# Patient Record
Sex: Female | Born: 1960 | Race: White | Hispanic: No | Marital: Married | Smoking: Never smoker
Health system: Southern US, Community
[De-identification: ages and names within clinical notes are randomized; demographics above are authoritative.]

## PROBLEM LIST (undated history)

## (undated) DIAGNOSIS — E7800 Pure hypercholesterolemia, unspecified: Secondary | ICD-10-CM

## (undated) DIAGNOSIS — R32 Unspecified urinary incontinence: Secondary | ICD-10-CM

## (undated) DIAGNOSIS — M797 Fibromyalgia: Secondary | ICD-10-CM

## (undated) DIAGNOSIS — I10 Essential (primary) hypertension: Secondary | ICD-10-CM

## (undated) DIAGNOSIS — F419 Anxiety disorder, unspecified: Secondary | ICD-10-CM

## (undated) DIAGNOSIS — F329 Major depressive disorder, single episode, unspecified: Secondary | ICD-10-CM

## (undated) DIAGNOSIS — K219 Gastro-esophageal reflux disease without esophagitis: Secondary | ICD-10-CM

## (undated) DIAGNOSIS — G2581 Restless legs syndrome: Secondary | ICD-10-CM

## (undated) DIAGNOSIS — IMO0002 Reserved for concepts with insufficient information to code with codable children: Secondary | ICD-10-CM

## (undated) DIAGNOSIS — F32A Depression, unspecified: Secondary | ICD-10-CM

## (undated) HISTORY — DX: Restless legs syndrome: G25.81

## (undated) HISTORY — DX: Fibromyalgia: M79.7

## (undated) HISTORY — DX: Depression, unspecified: F32.A

## (undated) HISTORY — DX: Unspecified urinary incontinence: R32

## (undated) HISTORY — DX: Anxiety disorder, unspecified: F41.9

## (undated) HISTORY — DX: Major depressive disorder, single episode, unspecified: F32.9

## (undated) HISTORY — DX: Pure hypercholesterolemia, unspecified: E78.00

## (undated) HISTORY — DX: Reserved for concepts with insufficient information to code with codable children: IMO0002

## (undated) HISTORY — DX: Essential (primary) hypertension: I10

## (undated) HISTORY — DX: Gastro-esophageal reflux disease without esophagitis: K21.9

---

## 1992-04-07 HISTORY — PX: DIAGNOSTIC LAPAROSCOPY: SUR761

## 1997-04-07 DIAGNOSIS — E7800 Pure hypercholesterolemia, unspecified: Secondary | ICD-10-CM

## 1997-04-07 HISTORY — DX: Pure hypercholesterolemia, unspecified: E78.00

## 1998-05-12 ENCOUNTER — Encounter: Payer: Self-pay | Admitting: Emergency Medicine

## 1998-05-12 ENCOUNTER — Emergency Department (HOSPITAL_COMMUNITY): Admission: EM | Admit: 1998-05-12 | Discharge: 1998-05-12 | Payer: Self-pay | Admitting: Emergency Medicine

## 1998-05-12 ENCOUNTER — Encounter: Payer: Self-pay | Admitting: *Deleted

## 1999-01-08 ENCOUNTER — Other Ambulatory Visit: Admission: RE | Admit: 1999-01-08 | Discharge: 1999-01-08 | Payer: Self-pay | Admitting: Obstetrics and Gynecology

## 1999-12-18 ENCOUNTER — Encounter: Admission: RE | Admit: 1999-12-18 | Discharge: 1999-12-18 | Payer: Self-pay | Admitting: Family Medicine

## 1999-12-18 ENCOUNTER — Encounter: Payer: Self-pay | Admitting: Family Medicine

## 2000-02-04 ENCOUNTER — Other Ambulatory Visit: Admission: RE | Admit: 2000-02-04 | Discharge: 2000-02-04 | Payer: Self-pay | Admitting: Obstetrics and Gynecology

## 2000-06-29 ENCOUNTER — Ambulatory Visit (HOSPITAL_COMMUNITY): Admission: RE | Admit: 2000-06-29 | Discharge: 2000-06-29 | Payer: Self-pay | Admitting: *Deleted

## 2000-06-30 ENCOUNTER — Encounter: Admission: RE | Admit: 2000-06-30 | Discharge: 2000-06-30 | Payer: Self-pay | Admitting: *Deleted

## 2000-06-30 ENCOUNTER — Encounter: Payer: Self-pay | Admitting: *Deleted

## 2001-04-07 DIAGNOSIS — G2581 Restless legs syndrome: Secondary | ICD-10-CM

## 2001-04-07 HISTORY — DX: Restless legs syndrome: G25.81

## 2001-04-30 ENCOUNTER — Other Ambulatory Visit: Admission: RE | Admit: 2001-04-30 | Discharge: 2001-04-30 | Payer: Self-pay | Admitting: Obstetrics and Gynecology

## 2002-04-07 HISTORY — PX: TUBAL LIGATION: SHX77

## 2002-05-17 ENCOUNTER — Other Ambulatory Visit: Admission: RE | Admit: 2002-05-17 | Discharge: 2002-05-17 | Payer: Self-pay | Admitting: Obstetrics and Gynecology

## 2002-08-17 ENCOUNTER — Encounter: Admission: RE | Admit: 2002-08-17 | Discharge: 2002-08-29 | Payer: Self-pay | Admitting: *Deleted

## 2002-10-26 ENCOUNTER — Ambulatory Visit (HOSPITAL_COMMUNITY): Admission: RE | Admit: 2002-10-26 | Discharge: 2002-10-26 | Payer: Self-pay | Admitting: Obstetrics and Gynecology

## 2002-10-26 ENCOUNTER — Encounter: Payer: Self-pay | Admitting: Obstetrics and Gynecology

## 2002-11-11 ENCOUNTER — Ambulatory Visit (HOSPITAL_COMMUNITY): Admission: RE | Admit: 2002-11-11 | Discharge: 2002-11-11 | Payer: Self-pay | Admitting: Obstetrics and Gynecology

## 2002-11-22 ENCOUNTER — Ambulatory Visit (HOSPITAL_BASED_OUTPATIENT_CLINIC_OR_DEPARTMENT_OTHER): Admission: RE | Admit: 2002-11-22 | Discharge: 2002-11-22 | Payer: Self-pay | Admitting: Family Medicine

## 2003-04-08 DIAGNOSIS — I10 Essential (primary) hypertension: Secondary | ICD-10-CM

## 2003-04-08 HISTORY — DX: Essential (primary) hypertension: I10

## 2003-06-23 ENCOUNTER — Other Ambulatory Visit: Admission: RE | Admit: 2003-06-23 | Discharge: 2003-06-23 | Payer: Self-pay | Admitting: Obstetrics and Gynecology

## 2003-11-14 ENCOUNTER — Ambulatory Visit (HOSPITAL_COMMUNITY): Admission: RE | Admit: 2003-11-14 | Discharge: 2003-11-14 | Payer: Self-pay | Admitting: Obstetrics and Gynecology

## 2003-11-20 ENCOUNTER — Encounter: Admission: RE | Admit: 2003-11-20 | Discharge: 2003-11-20 | Payer: Self-pay | Admitting: Obstetrics and Gynecology

## 2004-04-07 DIAGNOSIS — IMO0002 Reserved for concepts with insufficient information to code with codable children: Secondary | ICD-10-CM

## 2004-04-07 HISTORY — DX: Reserved for concepts with insufficient information to code with codable children: IMO0002

## 2004-10-07 ENCOUNTER — Other Ambulatory Visit: Admission: RE | Admit: 2004-10-07 | Discharge: 2004-10-07 | Payer: Self-pay | Admitting: Obstetrics and Gynecology

## 2004-11-14 ENCOUNTER — Ambulatory Visit (HOSPITAL_COMMUNITY): Admission: RE | Admit: 2004-11-14 | Discharge: 2004-11-14 | Payer: Self-pay | Admitting: Obstetrics and Gynecology

## 2005-02-25 ENCOUNTER — Other Ambulatory Visit: Admission: RE | Admit: 2005-02-25 | Discharge: 2005-02-25 | Payer: Self-pay | Admitting: Obstetrics and Gynecology

## 2005-07-21 ENCOUNTER — Other Ambulatory Visit: Admission: RE | Admit: 2005-07-21 | Discharge: 2005-07-21 | Payer: Self-pay | Admitting: Obstetrics and Gynecology

## 2005-10-07 ENCOUNTER — Other Ambulatory Visit: Admission: RE | Admit: 2005-10-07 | Discharge: 2005-10-07 | Payer: Self-pay | Admitting: Obstetrics and Gynecology

## 2005-11-27 ENCOUNTER — Ambulatory Visit (HOSPITAL_COMMUNITY): Admission: RE | Admit: 2005-11-27 | Discharge: 2005-11-27 | Payer: Self-pay | Admitting: Obstetrics and Gynecology

## 2006-12-16 ENCOUNTER — Ambulatory Visit (HOSPITAL_COMMUNITY): Admission: RE | Admit: 2006-12-16 | Discharge: 2006-12-16 | Payer: Self-pay | Admitting: Obstetrics and Gynecology

## 2007-10-20 ENCOUNTER — Ambulatory Visit (HOSPITAL_BASED_OUTPATIENT_CLINIC_OR_DEPARTMENT_OTHER): Admission: RE | Admit: 2007-10-20 | Discharge: 2007-10-20 | Payer: Self-pay | Admitting: Urology

## 2007-12-20 ENCOUNTER — Ambulatory Visit (HOSPITAL_COMMUNITY): Admission: RE | Admit: 2007-12-20 | Discharge: 2007-12-20 | Payer: Self-pay | Admitting: Obstetrics and Gynecology

## 2008-04-07 HISTORY — PX: BLADDER SUSPENSION: SHX72

## 2008-12-20 ENCOUNTER — Ambulatory Visit (HOSPITAL_COMMUNITY): Admission: RE | Admit: 2008-12-20 | Discharge: 2008-12-20 | Payer: Self-pay | Admitting: Obstetrics and Gynecology

## 2009-05-08 ENCOUNTER — Emergency Department (HOSPITAL_COMMUNITY): Admission: EM | Admit: 2009-05-08 | Discharge: 2009-05-08 | Payer: Self-pay | Admitting: Emergency Medicine

## 2009-12-21 ENCOUNTER — Ambulatory Visit (HOSPITAL_COMMUNITY): Admission: RE | Admit: 2009-12-21 | Discharge: 2009-12-21 | Payer: Self-pay | Admitting: Obstetrics and Gynecology

## 2010-08-20 NOTE — Op Note (Signed)
NAMEJULISA, [name]             ACCOUNT NO.:  [id number]   MEDICAL RECORD NO.:  [id number]          PATIENT TYPE:  AMB   LOCATION:  [facility]                         FACILITY:  [facility]   PHYSICIAN:  Ronald L. Earlene Plater, M.D.  DATE OF BIRTH:  04/05/1961   DATE OF PROCEDURE:  10/20/2007  DATE OF DISCHARGE:                               OPERATIVE REPORT   DIAGNOSIS:  Stress urinary incontinence.   OPERATIVE PROCEDURE:  Cystourethroscopy, placement of Lynx suprapubic  sling, and placement of Foley catheter.   SURGEON:  Lucrezia Starch. Earlene Plater, M.D.   ANESTHESIA:  LMA.   BLOOD LOSS:  150 mL.   TUBES:  A 16-French Foley, and vaginal pack   COMPLICATIONS:  Simple needle hole in right side of bladder.   PROCEDURE:  Heather [name] is a lovely 50 year old white female who  presents with a 15-year history of significant stress urinary  incontinence.  She has other underlying causes, but on urodynamics was  found to have some irritability and hypersensitivity of the bladder.  However, she was found to have a Valsalva leak point pressures of 55  cmH2O.  After understanding risks, benefits, and alternatives, she has  elected proceed with the above procedure.   PROCEDURE IN DETAIL:  The patient was placed in the supine position and,  after proper LMA anesthesia, was placed in the dorsal lithotomy  position, prepped and draped with Betadine in a sterile fashion.  A 16-  French Foley catheter was placed, and the bladder was drained.  The  submucosal area was injected with proximally 3 mL of 1% Xylocaine with  epinephrine.  A midline incision was made at the urethrovesical  junction, and flaps were created.  The endopelvic fascia was not  perforated.  Puncture wounds were obtained 1 fingerbreadth superior and  2 fingerbreadths lateral to the midline, superior to the pubic symphysis  and lateral to the midline, and utilizing the needles each was placed  through the endopelvic fascia, both the right and left  side of the  urethrovesical junction.  Inspection of bladder revealed that the right  needle had tangentially pierced the bladder.  Therefore, it was removed,  placed more laterally, and placed in position.  Reinspection revealed  that there were no needles within the bladder utilizing the 70-degree  lens. The [place] sling was then placed in position.  It deployed properly  and appeared to be in good position, and the vaginal wound was closed  with a running 2-0 Vicryl suture.  Reinspection of the bladder again on  distention revealed that there were no perforations, and the sling  appeared to be in what was felt to be good position, and the tapes were  cut at the skin level, and the skin was closed with Dermabond at the  puncture holes.  A  vaginal pack was placed with bacitracin ointment.  A 16-French Foley was  placed due to the small needle puncture hole.  It will be maintained for  approximately 5 days.  The patient tolerated the procedure well.  No  complications.  She was taken to the recovery room stable.  Ronald L. Earlene Plater, M.D.  Electronically Signed     RLD/MEDQ  D:  10/20/2007  T:  10/20/2007  Job:  [id number]

## 2010-08-23 NOTE — Op Note (Signed)
NAME:  Heather [name], Heather [name]                       ACCOUNT NO.:  [id number]   MEDICAL RECORD NO.:  [id number]                   PATIENT TYPE:  AMB   LOCATION:  [facility]                                  FACILITY:  [facility]   PHYSICIAN:  Cynthia P. Romine, M.D.             DATE OF BIRTH:  07-19-1960   DATE OF PROCEDURE:  11/11/2002  DATE OF DISCHARGE:                                 OPERATIVE REPORT   PREOPERATIVE DIAGNOSIS:  Desires permanent surgical sterilization.   POSTOPERATIVE DIAGNOSIS:  Desires permanent surgical sterilization.   PROCEDURE:  Falope laparoscopic tubal sterilization procedure.   SURGEON:  Cynthia P. Romine, M.D.   ANESTHESIA:  General endotracheal anesthesia.   ESTIMATED BLOOD LOSS:  Minimal.   COMPLICATIONS:  None.   PROCEDURE:  The patient was taken to the operating room and after induction  of adequate general endotracheal anesthesia, was placed in the dorsal  lithotomy position and prepped and draped in the usual fashion.  A Hulka  uterine manipulator was placed, the bladder was drained with a red rubber  catheter.  A small subumbilical incision was made, the Veress needle was  inserted into the peritoneal space.  Proper placement of the Veress needle  was tested with negative aspirate and free flow of saline through the Veress  needle, again with negative aspirate, and then by noting the response of a  drop of saline placed at the hub of the Veress needle with negative pressure  as the abdominal wall was elevated.  Pneumoperitoneum was secured with 2.5  liters of CO2.  A 10/11 mm trocar was then inserted into the peritoneal  space and proper placement noted with the laparoscope.  Next, a small  suprapubic incision was made and an 8 mm trocar was inserted under direct  visualization.  The pelvis was inspected and felt to be entirely normal.  The tubes and ovaries were freely mobile.  The uterus was a normal size and  shape.  There were no adhesions.  The appendix  was normal.  The Falope ring  applier was introduced and the Falope ring was placed.  The tube was  identified and carried to its fimbriated end.  The Falope ring was placed.  A good knuckle of tube was noted to be contained and no bleeding was  encountered.  Photographic documentation was taken of ring placement.  The  procedure was repeated on the left, again finding the tube and tracing it to  the fimbriated end.  The middle portion was elevated and a Falope ring was  placed.  Again, a good knuckle of tube was noted to be contained within the  lumen.  There was good blanching noted in the tube above the ring and no  bleeding was encountered.  The Falope ring applier was removed.  The  pneumoperitoneum was allowed to escape.  The trocar sleeves were removed.  The wounds were closed subcuticularly with 3-0 Vicryl.  Steri-Strips were  applied.  8 mL of 0.5% Marcaine with epinephrine was injected into the  incision for postop pain relief.  The instruments were removed from the  vagina and procedure was terminated.  The patient tolerated the procedure  well and went to the recovery room in satisfactory condition.                                              Cynthia P. Romine, M.D.   CPR/MEDQ  D:  11/11/2002  T:  11/11/2002  Job:  [id number]

## 2010-12-05 ENCOUNTER — Other Ambulatory Visit: Payer: Self-pay | Admitting: Obstetrics and Gynecology

## 2010-12-05 DIAGNOSIS — Z1231 Encounter for screening mammogram for malignant neoplasm of breast: Secondary | ICD-10-CM

## 2010-12-24 ENCOUNTER — Ambulatory Visit (HOSPITAL_COMMUNITY)
Admission: RE | Admit: 2010-12-24 | Discharge: 2010-12-24 | Disposition: A | Discharge status: Home / Self Care | Payer: BC Managed Care – PPO | Source: Ambulatory Visit | Attending: Obstetrics and Gynecology | Admitting: Obstetrics and Gynecology

## 2010-12-24 DIAGNOSIS — Z1231 Encounter for screening mammogram for malignant neoplasm of breast: Secondary | ICD-10-CM | POA: Insufficient documentation

## 2011-01-02 LAB — COMPREHENSIVE METABOLIC PANEL
ALT: 28
AST: 23
Albumin: 3.9
BUN: 15
CO2: 28
Calcium: 9.7
Chloride: 100
Potassium: 4.8
Sodium: 137
Total Protein: 6.2

## 2011-01-02 LAB — CBC
HCT: 37.7
Platelets: 247

## 2011-01-02 LAB — URINALYSIS, ROUTINE W REFLEX MICROSCOPIC
Glucose, UA: NEGATIVE
Ketones, ur: NEGATIVE
Protein, ur: NEGATIVE
Urobilinogen, UA: 0.2
pH: 6.5

## 2011-01-02 LAB — PROTIME-INR: Prothrombin Time: 12.4

## 2011-11-25 ENCOUNTER — Other Ambulatory Visit: Payer: Self-pay | Admitting: Obstetrics and Gynecology

## 2011-11-25 DIAGNOSIS — Z1231 Encounter for screening mammogram for malignant neoplasm of breast: Secondary | ICD-10-CM

## 2011-12-25 ENCOUNTER — Ambulatory Visit (HOSPITAL_COMMUNITY)
Admission: RE | Admit: 2011-12-25 | Discharge: 2011-12-25 | Disposition: A | Discharge status: Home / Self Care | Payer: BC Managed Care – PPO | Source: Ambulatory Visit | Attending: Obstetrics and Gynecology | Admitting: Obstetrics and Gynecology

## 2011-12-25 DIAGNOSIS — Z1231 Encounter for screening mammogram for malignant neoplasm of breast: Secondary | ICD-10-CM

## 2012-12-22 ENCOUNTER — Other Ambulatory Visit: Payer: Self-pay | Admitting: Gynecology

## 2012-12-22 DIAGNOSIS — Z1231 Encounter for screening mammogram for malignant neoplasm of breast: Secondary | ICD-10-CM

## 2012-12-29 ENCOUNTER — Encounter: Payer: Self-pay | Admitting: Obstetrics and Gynecology

## 2012-12-30 ENCOUNTER — Ambulatory Visit (HOSPITAL_COMMUNITY)
Admission: RE | Admit: 2012-12-30 | Discharge: 2012-12-30 | Disposition: A | Discharge status: Home / Self Care | Payer: BC Managed Care – PPO | Source: Ambulatory Visit | Attending: Gynecology | Admitting: Gynecology

## 2012-12-30 DIAGNOSIS — Z1231 Encounter for screening mammogram for malignant neoplasm of breast: Secondary | ICD-10-CM | POA: Insufficient documentation

## 2013-01-03 ENCOUNTER — Ambulatory Visit (INDEPENDENT_AMBULATORY_CARE_PROVIDER_SITE_OTHER): Payer: BC Managed Care – PPO | Admitting: Gynecology

## 2013-01-03 ENCOUNTER — Encounter: Payer: Self-pay | Admitting: Gynecology

## 2013-01-03 VITALS — BP 120/60 | HR 70 | Resp 16 | Ht 65.5 in | Wt 157.0 lb

## 2013-01-03 DIAGNOSIS — N898 Other specified noninflammatory disorders of vagina: Secondary | ICD-10-CM

## 2013-01-03 DIAGNOSIS — F329 Major depressive disorder, single episode, unspecified: Secondary | ICD-10-CM | POA: Insufficient documentation

## 2013-01-03 DIAGNOSIS — E7800 Pure hypercholesterolemia, unspecified: Secondary | ICD-10-CM | POA: Insufficient documentation

## 2013-01-03 DIAGNOSIS — Z01419 Encounter for gynecological examination (general) (routine) without abnormal findings: Secondary | ICD-10-CM

## 2013-01-03 DIAGNOSIS — Z124 Encounter for screening for malignant neoplasm of cervix: Secondary | ICD-10-CM

## 2013-01-03 DIAGNOSIS — Z0000 Encounter for general adult medical examination without abnormal findings: Secondary | ICD-10-CM

## 2013-01-03 DIAGNOSIS — G2581 Restless legs syndrome: Secondary | ICD-10-CM | POA: Insufficient documentation

## 2013-01-03 DIAGNOSIS — I10 Essential (primary) hypertension: Secondary | ICD-10-CM | POA: Insufficient documentation

## 2013-01-03 DIAGNOSIS — F32A Depression, unspecified: Secondary | ICD-10-CM | POA: Insufficient documentation

## 2013-01-03 LAB — POCT URINALYSIS DIPSTICK
Leukocytes, UA: NEGATIVE
Urobilinogen, UA: NEGATIVE

## 2013-01-03 MED ORDER — NYSTATIN-TRIAMCINOLONE 100000-0.1 UNIT/GM-% EX OINT: TOPICAL_OINTMENT | Freq: Two times a day (BID) | CUTANEOUS | Status: DC

## 2013-01-03 MED ORDER — FLUCONAZOLE 150 MG PO TABS: 150.0000 mg | ORAL_TABLET | Freq: Once | ORAL | Status: DC

## 2013-01-03 NOTE — Progress Notes (Signed)
52 y.o. Married Caucasian female   G1P1001 here for annual exam. Pt reports menses are absent..  She does not report hot flashes, does not have night sweats, does have vaginal dryness.  She is using lubricants, unknown.  She does not report post-menopasual bleeding.  LMP 7/13 no bleeding since.  Pt reports some vaginal dryness and itching.  Irregardless of sexual actiivty.  Pt is using an otc lubricant.    Patient's last menstrual period was 11/06/2011.          Sexually active: yes  The current method of family planning is post menopausal status.    Exercising: yes  bike, walk, yoga 3x/wk Last pap: 12/03/10 Negative Abnormal PAP: once 5 years ago, normal eversince Mammogram: 12/30/12 BI-RADS 1 BSE: not monthly q other month Colonoscopy:  no DEXA: none Alcohol: 6-8 glasses of wine/wk Tobacco: no  Hgb: PCP ; Urine: Negative  Health Maintenance  Topic Date Due  . Pap Smear  11/03/1980  . Mammogram  11/03/2012  . Colonoscopy  11/03/2012  . Influenza Vaccine  11/05/2012  . Tetanus/tdap  04/07/2014    Family History  Problem Relation Age of Onset  . Hypertension Mother   . Depression Mother   . Osteoporosis Mother 27  . Hypertension Father   . Depression Father   . Hypertension Maternal Grandmother   . Hypertension Maternal Grandfather   . Depression Paternal Grandmother     There are no active problems to display for this patient.   Past Medical History  Diagnosis Date  . Depression   . Anxiety   . Urinary incontinence     tx'd with [facility]  . Hypercholesteremia 1999  . Acid reflux   . Fibromyalgia   . Hypertension 2005  . 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 Restless leg syndrome 2003    Past Surgical History  Procedure Laterality Date  . Diagnostic laparoscopy  1994     Normal  . Tubal ligation  2004  . Bladder suspension  2010    Allergies: Penicillins; Sulfa antibiotics; and Codeine  Current Outpatient Prescriptions  Medication Sig Dispense Refill   . B Complex Vitamins (B COMPLEX PO) Take by mouth.      Marland Kitchen buPROPion (WELLBUTRIN XL) 300 MG 24 hr tablet Take 300 mg by mouth daily.      . Calcium-Magnesium-Vitamin D (CALCIUM MAGNESIUM PO) Take by mouth. Patient takes 1200 mg      . clonazePAM (KLONOPIN) 1 MG tablet Take 1 mg by mouth 2 (two) times daily as needed for anxiety.      Marland Kitchen escitalopram (LEXAPRO) 5 MG tablet Take 5 mg by mouth daily.      Marland Kitchen FOLIC ACID PO Take by mouth.      . hydrochlorothiazide (HYDRODIURIL) 25 MG tablet Take 25 mg by mouth daily.      Marland Kitchen LYSINE PO Take by mouth as needed.      . Nebivolol HCl (BYSTOLIC PO) Take by mouth.      . Omega-3 Fatty Acids (OMEGA 3 PO) Take by mouth.      . Pregabalin (LYRICA PO) Take by mouth.      . Ranitidine HCl (ZANTAC PO) Take by mouth as needed.       No current facility-administered medications for this visit.    ROS: Pertinent items are noted in HPI.  Exam:    BP 120/60  Pulse 70  Resp 16  LMP 11/06/2011 Weight change: @WEIGHTCHANGE @ Last 3 height recordings:  Ht Readings from Last 3 Encounters:  No data found for Ht   General appearance: alert, cooperative and appears stated age Head: Normocephalic, without obvious abnormality, atraumatic Neck: no adenopathy, no carotid bruit, no JVD, supple, symmetrical, trachea midline and thyroid not enlarged, symmetric, no tenderness/mass/nodules Lungs: clear to auscultation bilaterally Breasts: normal appearance, no masses or tenderness Heart: regular rate and rhythm, S1, S2 normal, no murmur, click, rub or gallop Abdomen: soft, non-tender; bowel sounds normal; no masses,  no organomegaly Extremities: extremities normal, atraumatic, no cyanosis or edema Skin: Skin color, texture, turgor normal. No rashes or lesions Lymph nodes: Cervical, supraclavicular, and axillary nodes normal. no inguinal nodes palpated Neurologic: Grossly normal   Pelvic: External genitalia:  Symmetrical rash with erythema from mid labia around  rectum              Urethra: normal appearing urethra with no masses, tenderness or lesions              Bartholins and Skenes: normal                 Vagina: normal appearing vagina with normal color and discharge, no lesions, vaginal discharge - white and scant, WET MOUNT done - results: negative for pathogens, normal epithelial cells, KOH done, pH 4.5              Cervix: normal appearance              Pap taken: yes        Bimanual Exam:  Uterus:  uterus is normal size, shape, consistency and nontender                                      Adnexa:    no masses                                      Rectovaginal: Confirms                                      Anus:  normal sphincter tone, no lesions  A: well woman Contact dermatitis     P: mammogram annual pap smear with HPV counseled on breast self exam, mammography screening, feminine hygiene, adequate intake of calcium and vitamin D, diet and exercise, suggested wearing wicking clothing when working out and changing when wet mycolog ointment to affected area, otc if possible, diflcuan, cotton underwear return annually or prn Discussed PAP guideline changes, importance of weight bearing exercises, calcium, vit D and balanced diet.  An After Visit Summary was printed and given to the patient.

## 2013-01-03 NOTE — Patient Instructions (Signed)

## 2013-01-04 ENCOUNTER — Ambulatory Visit: Payer: Self-pay | Admitting: Obstetrics and Gynecology

## 2013-01-04 ENCOUNTER — Ambulatory Visit: Payer: Self-pay | Admitting: Gynecology

## 2013-01-05 LAB — IPS PAP TEST WITH HPV

## 2013-01-07 ENCOUNTER — Telehealth: Payer: Self-pay | Admitting: *Deleted

## 2013-01-07 MED ORDER — TINIDAZOLE 500 MG PO TABS: ORAL_TABLET | ORAL | Status: DC

## 2013-01-07 NOTE — Telephone Encounter (Signed)
Message copied by Lorraine Lax on Fri Jan 07, 2013 10:32 AM ------      Message from: Douglass Rivers      Created: Thu Jan 06, 2013  8:06 AM       Recall 2, may have BV, had wet prep but not seen, she may want to try tindazole 2g for 2d (4x500mg ) call in 8tab if desires ------

## 2013-01-07 NOTE — Telephone Encounter (Signed)
Left Message To Call Back  

## 2013-01-07 NOTE — Telephone Encounter (Signed)
Patient notified see labs 

## 2013-01-07 NOTE — Addendum Note (Signed)
Addended by: Lorraine Lax on: 01/07/2013 11:33 AM   Modules accepted: Orders

## 2013-01-19 ENCOUNTER — Encounter: Payer: Self-pay | Admitting: Gynecology

## 2013-02-10 ENCOUNTER — Other Ambulatory Visit: Payer: Self-pay

## 2013-02-28 ENCOUNTER — Ambulatory Visit (INDEPENDENT_AMBULATORY_CARE_PROVIDER_SITE_OTHER): Payer: BC Managed Care – PPO | Admitting: Gynecology

## 2013-02-28 ENCOUNTER — Encounter: Payer: Self-pay | Admitting: Gynecology

## 2013-02-28 VITALS — BP 116/68 | HR 62 | Resp 14 | Ht 65.5 in | Wt 157.0 lb

## 2013-02-28 DIAGNOSIS — N898 Other specified noninflammatory disorders of vagina: Secondary | ICD-10-CM

## 2013-02-28 DIAGNOSIS — N762 Acute vulvitis: Secondary | ICD-10-CM

## 2013-02-28 DIAGNOSIS — N760 Acute vaginitis: Secondary | ICD-10-CM

## 2013-02-28 NOTE — Progress Notes (Signed)
Subjective:     Patient ID: Heather [name], female   DOB: [birth year], 52 y.o.   MRN: [id number]  HPI Comments: Pt here for f/u of a vulvar irritation and BV infection.  She aslo had a question regarding HPV infection she had years ago.    Review of Systems  Genitourinary: Negative for vaginal bleeding, vaginal discharge, vaginal pain and pelvic pain.  Skin: Negative for rash.       Objective:   Physical Exam  Nursing note and vitals reviewed. Constitutional: She is oriented to person, place, and time. She appears well-developed and well-nourished.  Genitourinary: Uterus normal. There is no rash, tenderness or lesion on the right labia. There is no rash, tenderness or lesion on the left labia. Cervix exhibits no motion tenderness and no discharge. Right adnexum displays no mass and no tenderness. Left adnexum displays no mass and no tenderness. No tenderness or bleeding around the vagina. No vaginal discharge found.  Lymphadenopathy:       Right: No inguinal adenopathy present.       Left: No inguinal adenopathy present.  Neurological: She is alert and oriented to person, place, and time.       Assessment:     Vulvitis Bacterial vaginosis     Plan:     Both have resolved, pt assured We discussed HPV infection and informed pt that although she had in past, she currently is negative, questions addressed.

## 2014-01-09 ENCOUNTER — Ambulatory Visit (INDEPENDENT_AMBULATORY_CARE_PROVIDER_SITE_OTHER): Payer: BC Managed Care – PPO | Admitting: Gynecology

## 2014-01-09 ENCOUNTER — Ambulatory Visit: Payer: BC Managed Care – PPO | Admitting: Gynecology

## 2014-01-09 ENCOUNTER — Encounter: Payer: Self-pay | Admitting: Gynecology

## 2014-01-09 VITALS — BP 124/70 | HR 68 | Ht 65.5 in | Wt 157.0 lb

## 2014-01-09 DIAGNOSIS — Z01419 Encounter for gynecological examination (general) (routine) without abnormal findings: Secondary | ICD-10-CM

## 2014-01-09 DIAGNOSIS — Z0000 Encounter for general adult medical examination without abnormal findings: Secondary | ICD-10-CM

## 2014-01-09 LAB — POCT URINALYSIS DIPSTICK
BILIRUBIN UA: NEGATIVE
Glucose, UA: NEGATIVE
Ketones, UA: NEGATIVE
Leukocytes, UA: NEGATIVE
Nitrite, UA: NEGATIVE
Protein, UA: NEGATIVE
UROBILINOGEN UA: NEGATIVE
pH, UA: 5

## 2014-01-09 NOTE — Progress Notes (Signed)
53 y.o. Caucasian female   G1P1001 here for annual exam. Pt reports does not have menses.  She does not report hot flashes, does not have night sweats, does have vaginal dryness.  She is using  using lubricants only during intercourse, .  She does not report post-menopasual bleeding.  Pt is using cocoanut oil for sex.    No LMP recorded. Patient is not currently having periods (Reason: Perimenopausal).          Sexually active: Yes.    The current method of family planning is none.    Exercising: Yes.    Home exercise routine includes yoga. Last pap: 01/03/13 neg HR HPV Abnormal PAP: once over 5 years ago, normal ever since Mammogram: 12/31/12 Bi-Rads Neg BSE: not monthly q other month Colonoscopy: 8/14, polyps removed, wnl DEXA:never Alcohol: 5-8 Tobacco:no UA: RBC-Trace, ph: 5.0  Health Maintenance  Topic Date Due  . Mammogram  11/03/2012  . Colonoscopy  11/03/2012  . Influenza Vaccine  11/05/2013  . Tetanus/tdap  04/07/2014  . Pap Smear  01/04/2016    Family History  Problem Relation Age of Onset  . Hypertension Mother   . Depression Mother   . Osteoporosis Mother 60  . Hypertension Father   . Depression Father   . Hypertension Maternal Grandmother   . Hypertension Maternal Grandfather   . Depression Paternal Grandmother     Patient Active Problem List   Diagnosis Date Noted  . Essential hypertension, benign 01/03/2013  . Restless leg 01/03/2013  . Depression 01/03/2013  . Pure hypercholesterolemia 01/03/2013    Past Medical History  Diagnosis Date  . Depression   . Anxiety   . Urinary incontinence     tx'd with [facility]  . Hypercholesteremia 1999  . Acid reflux   . Fibromyalgia   . Hypertension 2005  . 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 Restless leg syndrome 2003    Past Surgical History  Procedure Laterality Date  . Diagnostic laparoscopy  1994     Normal  . Tubal ligation  2004  . Bladder suspension  2010    Allergies: Penicillins; Sulfa  antibiotics; and Codeine  Current Outpatient Prescriptions  Medication Sig Dispense Refill  . acyclovir (ZOVIRAX) 400 MG tablet Take 400 mg by mouth 5 (five) times daily. As needed      . B Complex Vitamins (B COMPLEX PO) Take by mouth.      Marland Kitchen buPROPion (WELLBUTRIN XL) 300 MG 24 hr tablet Take 300 mg by mouth daily.      . Calcium-Magnesium-Vitamin D (CALCIUM MAGNESIUM PO) Take by mouth. Patient takes 1200 mg      . clonazePAM (KLONOPIN) 1 MG tablet Take 1 mg by mouth 2 (two) times daily as needed for anxiety.      Marland Kitchen escitalopram (LEXAPRO) 5 MG tablet Take 5 mg by mouth daily.      Marland Kitchen FOLIC ACID PO Take by mouth.      . hydrochlorothiazide (HYDRODIURIL) 25 MG tablet Take 25 mg by mouth daily.      Marland Kitchen LYSINE PO Take by mouth as needed.      . Nebivolol HCl (BYSTOLIC PO) Take 10 mg by mouth daily.       . Omega-3 Fatty Acids (OMEGA 3 PO) Take by mouth.      . Pregabalin (LYRICA PO) Take 25 mg by mouth at bedtime.       . Ranitidine HCl (ZANTAC PO) Take by mouth as needed.  No current facility-administered medications for this visit.    ROS: Pertinent items are noted in HPI.  Exam:    BP 124/70  Pulse 68  Ht 5' 5.5" (1.664 m)  Wt 157 lb (71.215 kg)  BMI 25.72 kg/m2 Weight change: @WEIGHTCHANGE @ Last 3 height recordings:  Ht Readings from Last 3 Encounters:  01/09/14 5' 5.5" (1.664 m)  02/28/13 5' 5.5" (1.664 m)  01/03/13 5' 5.5" (1.664 m)   General appearance: alert, cooperative and appears stated age Head: Normocephalic, without obvious abnormality, atraumatic Neck: no adenopathy, no carotid bruit, no JVD, supple, symmetrical, trachea midline and thyroid not enlarged, symmetric, no tenderness/mass/nodules Lungs: clear to auscultation bilaterally Breasts: Inspection negative, No nipple retraction or dimpling, No nipple discharge or bleeding, No axillary or supraclavicular adenopathy, Normal to palpation without dominant masses Heart: regular rate and rhythm, S1, S2 normal, no  murmur, click, rub or gallop Abdomen: soft, non-tender; bowel sounds normal; no masses,  no organomegaly Extremities: extremities normal, atraumatic, no cyanosis or edema Skin: Skin color, texture, turgor normal. No rashes or lesions Lymph nodes: Cervical, supraclavicular, and axillary nodes normal. no inguinal nodes palpated Neurologic: Grossly normal   Pelvic: External genitalia:  no lesions              Urethra: normal appearing urethra with no masses, tenderness or lesions              Bartholins and Skenes: Bartholin's, Urethra, Skene's normal                 Vagina: normal appearing vagina with normal color and discharge, no lesions              Cervix: normal appearance              Pap taken: No.        Bimanual Exam:  Uterus:  uterus is normal size, shape, consistency and nontender, right anterior fibroid-3cm                                      Adnexa:    no masses                                      Rectovaginal: Confirms                                      Anus:  normal sphincter tone, no lesions     1. Laboratory exam ordered as part of routine general medical examination  - POCT urinalysis dipstick  2. Encounter for routine gynecological examination  mammogram counseled on breast self exam, mammography screening, osteoporosis, adequate intake of calcium and vitamin D, diet and exercise return annually or prn Discussed PAP guideline changes, importance of weight bearing exercises, calcium, vit D and balanced diet.  An After Visit Summary was printed and given to the patient.

## 2014-02-01 ENCOUNTER — Other Ambulatory Visit: Payer: Self-pay | Admitting: Gynecology

## 2014-02-01 DIAGNOSIS — Z1231 Encounter for screening mammogram for malignant neoplasm of breast: Secondary | ICD-10-CM

## 2014-02-06 ENCOUNTER — Encounter: Payer: Self-pay | Admitting: Gynecology

## 2014-02-16 ENCOUNTER — Ambulatory Visit (HOSPITAL_COMMUNITY)
Admission: RE | Admit: 2014-02-16 | Discharge: 2014-02-16 | Disposition: A | Discharge status: Home / Self Care | Payer: BC Managed Care – PPO | Source: Ambulatory Visit | Attending: Gynecology | Admitting: Gynecology

## 2014-02-16 DIAGNOSIS — Z1231 Encounter for screening mammogram for malignant neoplasm of breast: Secondary | ICD-10-CM | POA: Diagnosis not present

## 2014-07-05 ENCOUNTER — Other Ambulatory Visit: Payer: Self-pay | Admitting: Family Medicine

## 2014-07-05 DIAGNOSIS — G4489 Other headache syndrome: Secondary | ICD-10-CM

## 2014-07-06 ENCOUNTER — Ambulatory Visit
Admission: RE | Admit: 2014-07-06 | Discharge: 2014-07-06 | Disposition: A | Discharge status: Home / Self Care | Payer: BLUE CROSS/BLUE SHIELD | Source: Ambulatory Visit | Attending: Family Medicine | Admitting: Family Medicine

## 2014-07-06 DIAGNOSIS — G4489 Other headache syndrome: Secondary | ICD-10-CM

## 2015-02-07 ENCOUNTER — Other Ambulatory Visit: Payer: Self-pay

## 2015-02-07 DIAGNOSIS — Z1231 Encounter for screening mammogram for malignant neoplasm of breast: Secondary | ICD-10-CM

## 2015-03-13 ENCOUNTER — Ambulatory Visit
Admission: RE | Admit: 2015-03-13 | Discharge: 2015-03-13 | Disposition: A | Discharge status: Home / Self Care | Payer: BLUE CROSS/BLUE SHIELD | Source: Ambulatory Visit

## 2015-03-13 DIAGNOSIS — Z1231 Encounter for screening mammogram for malignant neoplasm of breast: Secondary | ICD-10-CM

## 2015-04-27 ENCOUNTER — Ambulatory Visit (INDEPENDENT_AMBULATORY_CARE_PROVIDER_SITE_OTHER): Payer: BLUE CROSS/BLUE SHIELD | Admitting: Obstetrics and Gynecology

## 2015-04-27 ENCOUNTER — Encounter: Payer: Self-pay | Admitting: Obstetrics and Gynecology

## 2015-04-27 ENCOUNTER — Other Ambulatory Visit: Payer: Self-pay | Admitting: Obstetrics and Gynecology

## 2015-04-27 VITALS — BP 128/82 | HR 68 | Resp 17 | Ht 65.5 in | Wt 151.0 lb

## 2015-04-27 DIAGNOSIS — N952 Postmenopausal atrophic vaginitis: Secondary | ICD-10-CM

## 2015-04-27 DIAGNOSIS — R37 Sexual dysfunction, unspecified: Secondary | ICD-10-CM | POA: Diagnosis not present

## 2015-04-27 DIAGNOSIS — N941 Unspecified dyspareunia: Secondary | ICD-10-CM

## 2015-04-27 DIAGNOSIS — N3946 Mixed incontinence: Secondary | ICD-10-CM

## 2015-04-27 DIAGNOSIS — Z124 Encounter for screening for malignant neoplasm of cervix: Secondary | ICD-10-CM | POA: Diagnosis not present

## 2015-04-27 DIAGNOSIS — R6882 Decreased libido: Secondary | ICD-10-CM | POA: Diagnosis not present

## 2015-04-27 DIAGNOSIS — F5231 Female orgasmic disorder: Secondary | ICD-10-CM

## 2015-04-27 DIAGNOSIS — Z01419 Encounter for gynecological examination (general) (routine) without abnormal findings: Secondary | ICD-10-CM

## 2015-04-27 DIAGNOSIS — IMO0002 Reserved for concepts with insufficient information to code with codable children: Secondary | ICD-10-CM

## 2015-04-27 MED ORDER — ESTRADIOL 0.1 MG/GM VA CREA: TOPICAL_CREAM | VAGINAL | Status: DC

## 2015-04-27 NOTE — Progress Notes (Signed)
Patient ID: Heather [name], female   DOB: 1960/04/22, 55 y.o.   MRN: [id number] 55 y.o. G1P1001 MarriedCaucasianF here for annual exam.  She has a cyst in her left upper outer breast, gets bigger and smaller, tender and not tender. Not bothering her now. No vaginal bleeding. No longer having vasomotor symptoms. Sexually active, has dryness, uses a lubricant, helps some. She is barely able to have orgasm. She is on Lexapro, on 5 mg a day. She thinks she can go off of it. She is on it mainly for anxiety.  She has mixed urinary incontinence, she had a sling, helped some. Leaks more on the way to the bathroom. Leaks small amounts every day, wears a liner.     Patient's last menstrual period was 11/06/2011.          Sexually active: Yes.    The current method of family planning is post menopausal status.    Exercising: No.  The patient does not participate in regular exercise at present. Smoker:  no  Health Maintenance: Pap:  01-03-13 WNL, -hpv History of abnormal Pap:  Yes - positive HPV, over 10 years ago, no surgery on her cervix MMG: 03-14-15 WNL Colonoscopy:  2014 polyps, due this year BMD:   Never TDaP:  2006 Gardasil: N/A   reports that she has never smoked. She has never used smokeless tobacco. She reports that she drinks about 3.0 oz of alcohol per week. She reports that she does not use illicit drugs.Two daughters 68 and 46, younger is adopted. H/O infertility. Oldest in [place], youngest in school at [employer]. 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   Past Medical History  Diagnosis Date  . Depression   . Anxiety   . Urinary incontinence     tx'd with [facility]  . Hypercholesteremia 1999  . Acid reflux   . Fibromyalgia   . Hypertension 2005  . 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 Restless leg syndrome 2003    Past Surgical History  Procedure Laterality Date  . Diagnostic laparoscopy  1994     Normal  . Tubal ligation  2004  . Bladder suspension  2010    Current  Outpatient Prescriptions  Medication Sig Dispense Refill  . acyclovir (ZOVIRAX) 400 MG tablet Take 400 mg by mouth 5 (five) times daily. As needed    . B Complex Vitamins (B COMPLEX PO) Take by mouth.    Marland Kitchen buPROPion (WELLBUTRIN XL) 300 MG 24 hr tablet Take 300 mg by mouth daily.    . Calcium-Magnesium-Vitamin D (CALCIUM MAGNESIUM PO) Take by mouth. Patient takes 1200 mg    . carvedilol (COREG) 25 MG tablet TK 1 T PO BID  2  . clonazePAM (KLONOPIN) 1 MG tablet Take 1 mg by mouth 2 (two) times daily as needed for anxiety.    Marland Kitchen escitalopram (LEXAPRO) 5 MG tablet Take 5 mg by mouth daily.    Marland Kitchen FOLIC ACID PO Take by mouth.    . hydrochlorothiazide (HYDRODIURIL) 25 MG tablet Take 25 mg by mouth daily.    Marland Kitchen LYRICA 25 MG capsule TK 1 C PO QD  3  . LYSINE PO Take by mouth as needed.    . Nebivolol HCl (BYSTOLIC PO) Take 10 mg by mouth daily.     . Omega-3 Fatty Acids (OMEGA 3 PO) Take by mouth.    . Ranitidine HCl (ZANTAC PO) Take by mouth as needed.     No current facility-administered  medications for this visit.    Family History  Problem Relation Age of Onset  . Hypertension Mother   . Depression Mother   . Osteoporosis Mother 37  . Hypertension Father   . Depression Father   . Hypertension Maternal Grandmother   . Hypertension Maternal Grandfather   . Depression Paternal Grandmother   No family history of hip fracture. Maternal aunt with breast cancer.   Review of Systems  Constitutional: Negative.   HENT: Negative.   Eyes: Negative.   Respiratory: Negative.   Cardiovascular: Negative.   Gastrointestinal: Negative.   Endocrine: Negative.   Genitourinary: Negative.   Musculoskeletal: Negative.   Skin: Negative.   Allergic/Immunologic: Negative.   Neurological: Negative.   Psychiatric/Behavioral: Negative.     Exam:   BP 128/82 mmHg  Pulse 68  Resp 17  Ht 5' 5.5" (1.664 m)  Wt 151 lb (68.493 kg)  BMI 24.74 kg/m2  LMP 11/06/2011  Weight change: @ Height:    Height: 5' 5.5" (166.4 cm)  Ht Readings from Last 3 Encounters:  04/27/15 5' 5.5" (1.664 m)  01/09/14 5' 5.5" (1.664 m)  02/28/13 5' 5.5" (1.664 m)    General appearance: alert, cooperative and appears stated age Head: Normocephalic, without obvious abnormality, atraumatic Neck: no adenopathy, supple, symmetrical, trachea midline and thyroid normal to inspection and palpation Lungs: clear to auscultation bilaterally Breasts: normal appearance, no masses or tenderness Heart: regular rate and rhythm Abdomen: soft, non-tender; bowel sounds normal; no masses,  no organomegaly Extremities: extremities normal, atraumatic, no cyanosis or edema Skin: Skin color, texture, turgor normal. No rashes or lesions Lymph nodes: Cervical, supraclavicular, and axillary nodes normal. No abnormal inguinal nodes palpated Neurologic: Grossly normal   Pelvic: External genitalia:  no lesions              Urethra:  normal appearing urethra with no masses, tenderness or lesions              Bartholins and Skenes: normal                 Vagina: normal appearing vagina with normal color and discharge, no lesions. Mild atrophy              Cervix: no lesions               Bimanual Exam:  Uterus:  normal size, contour, position, consistency, mobility, non-tender              Adnexa: no mass, fullness, tenderness               Rectovaginal: Confirms               Anus:  normal sphincter tone, no lesions  Chaperone was present for exam.  A:  Well Woman with normal exam  Dyspareunia  Atrophic vaginitis  Low libido, difficulty with orgasm  Mixed urinary incontinence  P:   Pap with hpv  Start estrace vaginal cream  Lubrication with intercourse  Check testosterone levels, discussed multiple etiologies associated with low libido  She is trying to wean off of lexapro   Reading suggestions given for low libido  Discussed options of doing nothing, PT or medication for her incontinence. She will consider and let  me know if she wants to do something  Other labs and immunizations with primary MD  Continue calcium and vit D  Discussed breast self exam

## 2015-04-27 NOTE — Patient Instructions (Signed)

## 2015-05-01 LAB — IPS PAP TEST WITH HPV

## 2015-05-02 LAB — TESTOS,TOTAL,FREE AND SHBG (FEMALE)
SEX HORMONE BINDING GLOB.: 42 nmol/L (ref 17–124)
TESTOSTERONE,FREE: 0.9 pg/mL (ref 0.1–6.4)
Testosterone,Total,LC/MS/MS: 9 ng/dL (ref 2–45)

## 2015-05-07 ENCOUNTER — Telehealth: Payer: Self-pay | Admitting: Emergency Medicine

## 2015-05-07 NOTE — Telephone Encounter (Signed)
-----   Message from Romualdo Bolk, MD sent at 05/03/2015 10:35 AM EST ----- Please inform ASCUS, negative HPV. F/U pap and hpv in 3 years, annual exam next year.

## 2015-05-07 NOTE — Telephone Encounter (Signed)
Call to patient. She is advised of pap smear results ASCUS with negative HPV testing.  Annual in one year and pap with hpv again in 3 years.  02 recall placed.  Routing to provider for final review. Patient agreeable to disposition. Will close encounter.

## 2015-05-10 ENCOUNTER — Telehealth: Payer: Self-pay

## 2015-05-10 MED ORDER — ESTROGENS, CONJUGATED 0.625 MG/GM VA CREA: TOPICAL_CREAM | VAGINAL | Status: DC

## 2015-05-10 NOTE — Telephone Encounter (Signed)
Spoke with patient. Advised we received a PA request for her Estrace rx. Advised patient that since this is a new rx the insurance company request that she have tried and failed alternative options. Advised Premarin cream is an alternative that is usually covered with her insurance. Advised I have spoken with Dr.Jertson who is recommending Premarin cream 1/2 gram vaginally twice per week. She is agreeable. Rx for Premarin #30 g 0RF sent to pharmacy on file. She is agreeable and will contact the office if she has any difficulty filling this rx.  Dr.Jertson, agree with plan?

## 2015-05-17 NOTE — Telephone Encounter (Signed)
Premarin rx approved/cosinged by Dr.Jertson. Will close encounter.

## 2015-08-16 DIAGNOSIS — F331 Major depressive disorder, recurrent, moderate: Secondary | ICD-10-CM | POA: Diagnosis not present

## 2015-08-16 DIAGNOSIS — F411 Generalized anxiety disorder: Secondary | ICD-10-CM | POA: Diagnosis not present

## 2015-09-05 DIAGNOSIS — J069 Acute upper respiratory infection, unspecified: Secondary | ICD-10-CM | POA: Diagnosis not present

## 2015-09-12 DIAGNOSIS — H524 Presbyopia: Secondary | ICD-10-CM | POA: Diagnosis not present

## 2015-09-12 DIAGNOSIS — H52203 Unspecified astigmatism, bilateral: Secondary | ICD-10-CM | POA: Diagnosis not present

## 2015-09-28 DIAGNOSIS — M7062 Trochanteric bursitis, left hip: Secondary | ICD-10-CM | POA: Diagnosis not present

## 2015-09-28 DIAGNOSIS — M545 Low back pain: Secondary | ICD-10-CM | POA: Diagnosis not present

## 2015-11-27 DIAGNOSIS — F331 Major depressive disorder, recurrent, moderate: Secondary | ICD-10-CM | POA: Diagnosis not present

## 2015-11-27 DIAGNOSIS — F411 Generalized anxiety disorder: Secondary | ICD-10-CM | POA: Diagnosis not present

## 2015-12-12 DIAGNOSIS — H6980 Other specified disorders of Eustachian tube, unspecified ear: Secondary | ICD-10-CM | POA: Diagnosis not present

## 2015-12-12 DIAGNOSIS — I10 Essential (primary) hypertension: Secondary | ICD-10-CM | POA: Diagnosis not present

## 2015-12-12 DIAGNOSIS — E785 Hyperlipidemia, unspecified: Secondary | ICD-10-CM | POA: Diagnosis not present

## 2015-12-12 DIAGNOSIS — F4323 Adjustment disorder with mixed anxiety and depressed mood: Secondary | ICD-10-CM | POA: Diagnosis not present

## 2016-01-17 DIAGNOSIS — J0100 Acute maxillary sinusitis, unspecified: Secondary | ICD-10-CM | POA: Diagnosis not present

## 2016-02-14 ENCOUNTER — Other Ambulatory Visit: Payer: Self-pay | Admitting: Obstetrics and Gynecology

## 2016-02-14 DIAGNOSIS — Z1231 Encounter for screening mammogram for malignant neoplasm of breast: Secondary | ICD-10-CM

## 2016-02-21 ENCOUNTER — Encounter: Payer: Self-pay | Admitting: Podiatry

## 2016-02-21 ENCOUNTER — Ambulatory Visit (INDEPENDENT_AMBULATORY_CARE_PROVIDER_SITE_OTHER): Payer: BLUE CROSS/BLUE SHIELD | Admitting: Podiatry

## 2016-02-21 ENCOUNTER — Ambulatory Visit (INDEPENDENT_AMBULATORY_CARE_PROVIDER_SITE_OTHER): Payer: BLUE CROSS/BLUE SHIELD

## 2016-02-21 VITALS — BP 133/89 | HR 64

## 2016-02-21 DIAGNOSIS — M779 Enthesopathy, unspecified: Secondary | ICD-10-CM

## 2016-02-21 DIAGNOSIS — M21619 Bunion of unspecified foot: Secondary | ICD-10-CM | POA: Diagnosis not present

## 2016-02-21 NOTE — Patient Instructions (Addendum)
Bunionectomy A bunionectomy is a surgical procedure to remove a bunion. A bunion is a visible bump of bone on the inside of your foot where your big toe meets the rest of your foot. A bunion can develop when pressure turns this bone (first metatarsal) toward the other toes. Shoes that are too tight are the most common cause of bunions. Bunions can also be caused by diseases, such as arthritis and polio. You may need a bunionectomy if your bunion is very large and painful or it affects your ability to walk. Tell a health care provider about:  Any allergies you have.  All medicines you are taking, including vitamins, herbs, eye drops, creams, and over-the-counter medicines.  Any problems you or family members have had with anesthetic medicines.  Any blood disorders you have.  Any surgeries you have had.  Any medical conditions you have. What are the risks? Generally, this is a safe procedure. However, problems may occur, including:  Infection.  Pain.  Nerve damage.  Bleeding or blood clots.  Reactions to medicines.  Numbness, stiffness, or arthritis in your toe.  Foot problems that continue even after the procedure. What happens before the procedure?  Ask your health care provider about:  Changing or stopping your regular medicines. This is especially important if you are taking diabetes medicines or blood thinners.  Taking medicines such as aspirin and ibuprofen. These medicines can thin your blood. Do not take these medicines before your procedure if your health care provider instructs you not to.  Do not drink alcohol before the procedure as directed by your health care provider.  Do not use tobacco products, including cigarettes, chewing tobacco, or electronic cigarettes, before the procedure as directed by your health care provider. If you need help quitting, ask your health care provider.  Ask your health care provider what kind of medicine you will be given during  your procedure. A bunionectomy may be done using one of these:  A medicine that numbs the area (local anesthetic).  A medicine that makes you go to sleep (general anesthetic). If you will be given general anesthetic, do not eat or drink anything after midnight on the night before the procedure or as directed by your health care provider. What happens during the procedure?  An IV tube may be inserted into a vein.  You will be given local anesthetic or general anesthetic.  The surgeon will make a cut (incision) over the enlarged area at the first joint of the big toe. The surgeon will remove the bunion.  You may have more than one incision if any of the bones in your big toe need to be moved. A bone itself may need to be cut.  Sometimes the tissues around the big toe may also need to be cut then tightened or loosened to reposition the toe.  Screws or other hardware may be used to keep your foot in thecorrect position.  The incision will be closed with stitches (sutures) and covered with adhesive strips or another type of bandage (dressing). What happens after the procedure?  You may spend some time in a recovery area.  Your blood pressure, heart rate, breathing rate, and blood oxygen level will be monitored often until the medicines you were given have worn off. This information is not intended to replace advice given to you by your health care provider. Make sure you discuss any questions you have with your health care provider. Document Released: 03/07/2005 Document Revised: 08/30/2015 Document Reviewed: 11/09/2013   Elsevier Interactive Patient Education  2017 [employer] Inc[employer]  

## 2016-02-21 NOTE — Progress Notes (Signed)
Subjective:     Patient ID: Heather PinaAngela M Haver, female   DOB: 03/15/1961, 55 y.o.   MRN: [id number]  HPI patient states she has a painful bunion deformity left over right and it makes it hard for her to wear shoe gear and she's tried wider shoes she's tried padding the area and soaks without relief of symptoms and it's gradually getting worse over the last several years   Review of Systems  All other systems reviewed and are negative.      Objective:   Physical Exam  Constitutional: She is oriented to person, place, and time.  Cardiovascular: Intact distal pulses.   Musculoskeletal: Normal range of motion.  Neurological: She is oriented to person, place, and time.  Skin: Skin is warm.  Nursing note and vitals reviewed.  neurovascular status found to be intact with muscle strength adequate range of motion within normal limits with patient noted to have large hyperostosis medial aspect first metatarsal head left over right with redness around the joint surface and also complains of foot inflammation if she does a lot of walking. 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 I do think aggressive Eliberto Ivoryustin would give her good relief and even that we may not get complete radiographic correction I do think it will do a good job of helping her problem. This is what she wants and she'll have performed in January. I did go ahead today and scanned for orthotics to try to give her better foot balance and to hopefully slow down deformity on the right  X-ray report indicated elevation of the intermetatarsal angle left of approximate 16 with tibial sesamoidal shift position and mild to moderate deformity on the right

## 2016-02-21 NOTE — Progress Notes (Signed)
   Subjective:    Patient ID: Caryl PinaAngela M Brewbaker, female    DOB: [birth year], 55 y.o.   MRN: [id number]  HPI    Review of Systems  Musculoskeletal: Positive for arthralgias.  Psychiatric/Behavioral: The patient is nervous/anxious.   All other systems reviewed and are negative.      Objective:   Physical Exam        Assessment & Plan:

## 2016-03-10 ENCOUNTER — Telehealth: Payer: Self-pay | Admitting: Podiatry

## 2016-03-10 NOTE — Telephone Encounter (Signed)
I have called for a second time to get pt scheduled to pick up orthotics and the voicemail is full.

## 2016-03-21 ENCOUNTER — Ambulatory Visit
Admission: RE | Admit: 2016-03-21 | Discharge: 2016-03-21 | Disposition: A | Discharge status: Home / Self Care | Payer: BLUE CROSS/BLUE SHIELD | Source: Ambulatory Visit | Attending: Obstetrics and Gynecology | Admitting: Obstetrics and Gynecology

## 2016-03-21 DIAGNOSIS — Z1231 Encounter for screening mammogram for malignant neoplasm of breast: Secondary | ICD-10-CM

## 2016-04-08 ENCOUNTER — Telehealth: Payer: Self-pay | Admitting: Obstetrics and Gynecology

## 2016-04-08 NOTE — Telephone Encounter (Signed)
Left message to call and reschedule dr cancel appointment.

## 2016-04-10 ENCOUNTER — Encounter: Payer: Self-pay | Admitting: Podiatry

## 2016-04-10 ENCOUNTER — Ambulatory Visit (INDEPENDENT_AMBULATORY_CARE_PROVIDER_SITE_OTHER): Payer: BLUE CROSS/BLUE SHIELD | Admitting: Podiatry

## 2016-04-10 DIAGNOSIS — M21619 Bunion of unspecified foot: Secondary | ICD-10-CM

## 2016-04-10 NOTE — Progress Notes (Signed)
Subjective:     Patient ID: Heather [name], female   DOB: 12/15/1960, 56 y.o.   MRN: [id number]  HPI patient presents for correction of bunion deformity left foot stating that it's been sore and that she also we'll eventually need the other one done. Presents with husband today   Review of Systems     Objective:   Physical Exam Neurovascular status intact muscle strength adequate range of motion was found to be intact with patient noted to have large hyperostosis medial aspect first metatarsal head left with redness around the joint and pain when palpated. 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 I spent a great of time going over with them the etiology of this condition and structural malalignment and discussed procedure consisting of distal osteotomy explaining we may not get complete correction but should put it in a very good clinical position. I allowed her to read consent form going over alternative treatments complications and she understands all of this wanting surgery and signs consent form after extensive review. [facility] and understands recovery from this type procedure can take upwards of 6 months and has air fracture walker dispensed for postoperative usage and I want her to get used to it prior to surgery

## 2016-04-10 NOTE — Patient Instructions (Addendum)
WEARING INSTRUCTIONS FOR ORTHOTICS  Don't expect to be comfortable wearing your orthotic devices for the first time.  Like eyeglasses, you may be aware of them as time passes, they will not be uncomfortable and you will enjoy wearing them.  FOLLOW THESE INSTRUCTIONS EXACTLY!  1. Wear your orthotic devices for:       Not more than 1 hour the first day.       Not more than 2 hours the second day.       Not more than 3 hours the third day and so on.        Or wear them for as long as they feel comfortable.       If you experience discomfort in your feet or legs take them out.  When feet & legs feel       better, put them back in.  You do need to be consistent and wear them a little        everyday. 2.   If at any time the orthotic devices become acutely uncomfortable before the       time for that particular day, STOP WEARING THEM. 3.   On the next day, do not increase the wearing time. 4.   Subsequently, increase the wearing time by 15-30 minutes only if comfortable to do       so. 5.   You will be seen by your doctor about 2-4 weeks after you receive your orthotic       devices, at which time you will probably be wearing your devices comfortably        for about 8 hours or more a day. 6.   Some patients occasionally report mild aches or discomfort in other parts of the of       body such as the knees, hips or back after 3 or 4 consecutive hours of wear.  If this       is the case with you, do not extend your wearing time.  Instead, cut it back an hour or       two.  In all likelihood, these symptoms will disappear in a short period of time as your       body posture realigns itself and functions more efficiently. 7.   It is possible that your orthotic device may require some small changes or adjustment       to improve their function or make them more comfortable.   This is usually not done       before one to three months have elapsed.  These adjustments are made in        accordance  with the changed position your feet are assuming as a result of       improved biomechanical function. 8.   In women's shoes, it's not unusual for your heel to slip out of the shoe, particularly if       they are step-in-shoes.  If this is the case, try other shoes or other styles.  Try to       purchase shoes which have deeper heal seats or higher heel counters. 9.   Squeaking of orthotics devices in the shoes is due to the movement of the devices       when they are functioning normally.  To eliminate squeaking, simply dust some       baby powder into your shoes before inserting the devices.  If this does not work,          apply soap or wax to the edges of the orthotic devices or put a tissue into the shoes. 10. It is important that you follow these directions explicitly.  Failure to do so will simply       prolong the adjustment period or create problems which are easily avoided.  It makes       no difference if you are wearing your orthotic devices for only a few hours after        several months, so long as you are wearing them comfortably for those hours. 11. If you have any questions or complaints, contact our office.  We have no way of       knowing about your problems unless you tell us.  If we do not hear from you, we will       assume that you are proceeding well.   Pre-Operative Instructions  Congratulations, you have decided to take an important step to improving your quality of life.  You can be assured that the doctors of [facility] will be with you every step of the way.  2. Plan to be at the surgery center/hospital at least 1 (one) hour prior to your scheduled time unless otherwise directed by the surgical center/hospital staff.  You must have a responsible adult accompany you, remain during the surgery and drive you home.  Make sure you have directions to the surgical center/hospital and know how to get there on time. 3. For hospital based surgery you will need to  obtain a history and physical form from your family physician within 1 month prior to the date of surgery- we will give you a form for you primary physician.  4. We make every effort to accommodate the date you request for surgery.  There are however, times where surgery dates or times have to be moved.  We will contact you as soon as possible if a change in schedule is required.   5. No Aspirin/Ibuprofen for one week before surgery.  If you are on aspirin, any non-steroidal anti-inflammatory medications (Mobic, Aleve, Ibuprofen) you should stop taking it 7 days prior to your surgery.  You make take Tylenol  For pain prior to surgery.  6. Medications- If you are taking daily heart and blood pressure medications, seizure, reflux, allergy, asthma, anxiety, pain or diabetes medications, make sure the surgery center/hospital is aware before the day of surgery so they may notify you which medications to take or avoid the day of surgery. 7. No food or drink after midnight the night before surgery unless directed otherwise by surgical center/hospital staff. 8. No alcoholic beverages 24 hours prior to surgery.  No smoking 24 hours prior to or 24 hours after surgery. 9. Wear loose pants or shorts- loose enough to fit over bandages, boots, and casts. 10. No slip on shoes, sneakers are best. 11. Bring your boot with you to the surgery center/hospital.  Also bring crutches or a walker if your physician has prescribed it for you.  If you do not have this equipment, it will be provided for you after surgery. 12. If you have not been contracted by the surgery center/hospital by the day before your surgery, call to confirm the date and time of your surgery. 13. Leave-time from work may vary depending on the type of surgery you have.  Appropriate arrangements should be made prior to surgery with your employer. 14. Prescriptions will be provided immediately following surgery by your doctor.  Have these filled   as soon as  possible after surgery and take the medication as directed. 15. Remove nail polish on the operative foot. 16. Wash the night before surgery.  The night before surgery wash the foot and leg well with the antibacterial soap provided and water paying special attention to beneath the toenails and in between the toes.  Rinse thoroughly with water and dry well with a towel.  Perform this wash unless told not to do so by your physician.  Enclosed: 1 Ice pack (please put in freezer the night before surgery)   1 Hibiclens skin cleaner   Pre-op Instructions  If you have any questions regarding the instructions, do not hesitate to call our office.  [place]: [address] [place], [place] [address] [phone]  [place]: [address] [place], [place] [address] [phone]  [place]: [address]  [place], [place] [address] [phone]   Dr. Norman Regal DPM, Dr. Matthew Wagoner DPM, Dr. M. Todd Hyatt DPM, Dr. Titorya Stover DPM 

## 2016-04-15 ENCOUNTER — Encounter: Payer: Self-pay | Admitting: Podiatry

## 2016-04-15 DIAGNOSIS — M2012 Hallux valgus (acquired), left foot: Secondary | ICD-10-CM | POA: Diagnosis not present

## 2016-04-15 DIAGNOSIS — I10 Essential (primary) hypertension: Secondary | ICD-10-CM | POA: Diagnosis not present

## 2016-04-15 HISTORY — PX: BUNIONECTOMY: SHX129

## 2016-04-16 ENCOUNTER — Telehealth: Payer: Self-pay

## 2016-04-16 NOTE — Telephone Encounter (Signed)
LVM for questions or concerns regarding post op status

## 2016-04-17 NOTE — Progress Notes (Signed)
DOS 01.09.2018 Heather [name] Bunionectomy (cutting and moving bone) with Pin Fixation Left Foot

## 2016-04-23 ENCOUNTER — Other Ambulatory Visit: Payer: BLUE CROSS/BLUE SHIELD

## 2016-04-24 ENCOUNTER — Other Ambulatory Visit: Payer: BLUE CROSS/BLUE SHIELD

## 2016-04-25 ENCOUNTER — Encounter: Payer: Self-pay | Admitting: Podiatry

## 2016-04-25 ENCOUNTER — Ambulatory Visit (INDEPENDENT_AMBULATORY_CARE_PROVIDER_SITE_OTHER): Payer: BLUE CROSS/BLUE SHIELD

## 2016-04-25 ENCOUNTER — Ambulatory Visit (INDEPENDENT_AMBULATORY_CARE_PROVIDER_SITE_OTHER): Payer: BLUE CROSS/BLUE SHIELD | Admitting: Podiatry

## 2016-04-25 DIAGNOSIS — Z9889 Other specified postprocedural states: Secondary | ICD-10-CM | POA: Diagnosis not present

## 2016-04-25 DIAGNOSIS — M779 Enthesopathy, unspecified: Secondary | ICD-10-CM

## 2016-04-25 DIAGNOSIS — M21619 Bunion of unspecified foot: Secondary | ICD-10-CM

## 2016-04-27 NOTE — Progress Notes (Signed)
Subjective:     Patient ID: Heather [name], female   DOB: 1961-02-23, 56 y.o.   MRN: [id number]  HPI patient presents stating that her left foot is doing very well and she's very pleased with minimal discomfort swell   Review of Systems     Objective:   Physical Exam Neurovascular status intact muscle strength adequate range of motion within normal limits with patient found to have good healing left foot with wound edges well coapted no drainage and good alignment of the first MPJ with good range of motion    Assessment:     Doing well post osteotomy first metatarsal left    Plan:     H&P x-rays reviewed and recommended continued elevation compression immobilization with gradual increase in activity levels. Reappoint 2 weeks or earlier if needed  X-ray was found that indicate good correction of deformity with good underlying structural position

## 2016-05-01 ENCOUNTER — Encounter: Payer: Self-pay | Admitting: Podiatry

## 2016-05-01 ENCOUNTER — Ambulatory Visit (INDEPENDENT_AMBULATORY_CARE_PROVIDER_SITE_OTHER): Payer: Self-pay | Admitting: Podiatry

## 2016-05-01 ENCOUNTER — Ambulatory Visit: Payer: BLUE CROSS/BLUE SHIELD | Admitting: Obstetrics and Gynecology

## 2016-05-01 ENCOUNTER — Ambulatory Visit (INDEPENDENT_AMBULATORY_CARE_PROVIDER_SITE_OTHER): Payer: BLUE CROSS/BLUE SHIELD

## 2016-05-01 VITALS — BP 143/91 | HR 62 | Resp 16

## 2016-05-01 DIAGNOSIS — M21619 Bunion of unspecified foot: Secondary | ICD-10-CM | POA: Diagnosis not present

## 2016-05-01 DIAGNOSIS — Z9889 Other specified postprocedural states: Secondary | ICD-10-CM

## 2016-05-01 NOTE — Progress Notes (Signed)
Subjective:     Patient ID: Heather PinaAngela M Mccaskill, female   DOB: Sep 19, 1960, 56 y.o.   MRN: [id number]  HPI patient presents stating her foot was swollen and she just wanted to have it checked to make sure there was no problems   Review of Systems     Objective:   Physical Exam Neurovascular status intact negative Homans sign was noted with edema around the left first metatarsal but no active drainage in the incision or proximal edema erythema drainage noted    Assessment:     Most likely inflammatory reaction secondary to increased activity with no current indication of infection    Plan:     Precautionary x-ray taken and allow patient to return to activities done before and this should be uneventful. Reappoint to recheck  X-ray report indicates osteotomy is healing well pins are intact with no indications of movement

## 2016-05-06 ENCOUNTER — Encounter: Payer: Self-pay | Admitting: Podiatry

## 2016-05-09 ENCOUNTER — Ambulatory Visit (INDEPENDENT_AMBULATORY_CARE_PROVIDER_SITE_OTHER): Payer: BLUE CROSS/BLUE SHIELD | Admitting: Podiatry

## 2016-05-09 ENCOUNTER — Ambulatory Visit (INDEPENDENT_AMBULATORY_CARE_PROVIDER_SITE_OTHER): Payer: BLUE CROSS/BLUE SHIELD

## 2016-05-09 ENCOUNTER — Encounter: Payer: Self-pay | Admitting: Podiatry

## 2016-05-09 DIAGNOSIS — Z9889 Other specified postprocedural states: Secondary | ICD-10-CM

## 2016-05-10 NOTE — Progress Notes (Signed)
Subjective:     Patient ID: Heather [name], female   DOB: 1960-11-23, 56 y.o.   MRN: [id number]  HPI patient states she's doing well with her left foot with good correction and good range of motion and is able to walk without pain   Review of Systems     Objective:   Physical Exam  Neurovascular status intact negative Homans sign noted with patient's left foot doing well with wound edges well coapted good range of motion with no crepitus noted    Assessment:     Doing well post foot surgery left    Plan:     Advised on increase in activity gradual reduction of immobilization with compression stocking and elevation still recommended. Reappoint 4 weeks or earlier if needed  X-ray report indicates osteotomy is healing well with fixation in place and joint congruence

## 2016-05-13 ENCOUNTER — Ambulatory Visit: Payer: BLUE CROSS/BLUE SHIELD | Admitting: Obstetrics and Gynecology

## 2016-05-15 ENCOUNTER — Ambulatory Visit (INDEPENDENT_AMBULATORY_CARE_PROVIDER_SITE_OTHER): Payer: BLUE CROSS/BLUE SHIELD | Admitting: Obstetrics and Gynecology

## 2016-05-15 ENCOUNTER — Encounter: Payer: Self-pay | Admitting: Obstetrics and Gynecology

## 2016-05-15 VITALS — BP 122/62 | HR 80 | Resp 14 | Ht 65.5 in | Wt 154.0 lb

## 2016-05-15 DIAGNOSIS — N3946 Mixed incontinence: Secondary | ICD-10-CM

## 2016-05-15 DIAGNOSIS — Z01419 Encounter for gynecological examination (general) (routine) without abnormal findings: Secondary | ICD-10-CM | POA: Diagnosis not present

## 2016-05-15 NOTE — Patient Instructions (Addendum)
EXERCISE AND DIET:  We recommended that you start or continue a regular exercise program for good health. Regular exercise means any activity that makes your heart beat faster and makes you sweat.  We recommend exercising at least 30 minutes per day at least 3 days a week, preferably 4 or 5.  We also recommend a diet low in fat and sugar.  Inactivity, poor dietary choices and obesity can cause diabetes, heart attack, stroke, and kidney damage, among others.    ALCOHOL AND SMOKING:  Women should limit their alcohol intake to no more than 7 drinks/beers/glasses of wine (combined, not each!) per week. Moderation of alcohol intake to this level decreases your risk of breast cancer and liver damage. And of course, no recreational drugs are part of a healthy lifestyle.  And absolutely no smoking or even second hand smoke. Most people know smoking can cause heart and lung diseases, but did you know it also contributes to weakening of your bones? Aging of your skin?  Yellowing of your teeth and nails?  CALCIUM AND VITAMIN D:  Adequate intake of calcium and Vitamin D are recommended.  The recommendations for exact amounts of these supplements seem to change often, but generally speaking 600 mg of calcium (either carbonate or citrate) and 800 units of Vitamin D per day seems prudent. Certain women may benefit from higher intake of Vitamin D.  If you are among these women, your doctor will have told you during your visit.    PAP SMEARS:  Pap smears, to check for cervical cancer or precancers,  have traditionally been done yearly, although recent scientific advances have shown that most women can have pap smears less often.  However, every woman still should have a physical exam from her gynecologist every year. It will include a breast check, inspection of the vulva and vagina to check for abnormal growths or skin changes, a visual exam of the cervix, and then an exam to evaluate the size and shape of the uterus and  ovaries.  And after 54 years of age, a rectal exam is indicated to check for rectal cancers. We will also provide age appropriate advice regarding health maintenance, like when you should have certain vaccines, screening for sexually transmitted diseases, bone density testing, colonoscopy, mammograms, etc.   MAMMOGRAMS:  All women over 40 years old should have a yearly mammogram. Many facilities now offer a "3D" mammogram, which may cost around $50 extra out of pocket. If possible,  we recommend you accept the option to have the 3D mammogram performed.  It both reduces the number of women who will be called back for extra views which then turn out to be normal, and it is better than the routine mammogram at detecting truly abnormal areas.    COLONOSCOPY:  Colonoscopy to screen for colon cancer is recommended for all women at age 50.  We know, you hate the idea of the prep.  We agree, BUT, having colon cancer and not knowing it is worse!!  Colon cancer so often starts as a polyp that can be seen and removed at colonscopy, which can quite literally save your life!  And if your first colonoscopy is normal and you have no family history of colon cancer, most women don't have to have it again for 10 years.  Once every ten years, you can do something that may end up saving your life, right?  We will be happy to help you get it scheduled when you are ready.    Be sure to check your insurance coverage so you understand how much it will cost.  It may be covered as a preventative service at no cost, but you should check your particular policy.     Urinary Incontinence Urinary incontinence is the involuntary loss of urine from your bladder. CAUSES  There are many causes of urinary incontinence. They include:  Medicines.  Infections.  Prostatic enlargement, leading to overflow of urine from your bladder.  Surgery.  Neurological diseases.  Emotional factors. SIGNS AND SYMPTOMS Urinary Incontinence can be  divided into four types: 1. Urge incontinence. Urge incontinence is the involuntary loss of urine before you have the opportunity to go to the bathroom. There is a sudden urge to void but not enough time to reach a bathroom. 2. Stress incontinence. Stress incontinence is the sudden loss of urine with any activity that forces urine to pass. It is commonly caused by anatomical changes to the pelvis and sphincter areas of your body. 3. Overflow incontinence. Overflow incontinence is the loss of urine from an obstructed opening to your bladder. This results in a backup of urine and a resultant buildup of pressure within the bladder. When the pressure within the bladder exceeds the closing pressure of the sphincter, the urine overflows, which causes incontinence, similar to water overflowing a dam. 4. Total incontinence. Total incontinence is the loss of urine as a result of the inability to store urine within your bladder. DIAGNOSIS  Evaluating the cause of incontinence may require:  A thorough and complete medical and obstetric history.  A complete physical exam.  Laboratory tests such as a urine culture and sensitivities. When additional tests are indicated, they can include:  An ultrasound exam.  Kidney and bladder X-rays.  Cystoscopy. This is an exam of the bladder using a narrow scope.  Urodynamic testing to test the nerve function to the bladder and sphincter areas. TREATMENT  Treatment for urinary incontinence depends on the cause:  For urge incontinence caused by a bacterial infection, antibiotics will be prescribed. If the urge incontinence is related to medicines you take, your health care provider may have you change the medicine.  For stress incontinence, surgery to re-establish anatomical support to the bladder or sphincter, or both, will often correct the condition.  For overflow incontinence caused by an enlarged prostate, an operation to open the channel through the enlarged  prostate will allow the flow of urine out of the bladder. In women with fibroids, a hysterectomy may be recommended.  For total incontinence, surgery on your urinary sphincter may help. An artificial urinary sphincter (an inflatable cuff placed around the urethra) may be required. In women who have developed a hole-like passage between their bladder and vagina (vesicovaginal fistula), surgery to close the fistula often is required. HOME CARE INSTRUCTIONS  Normal daily hygiene and the use of pads or adult diapers that are changed regularly will help prevent odors and skin damage.  Avoid caffeine. It can overstimulate your bladder.  Use the bathroom regularly. Try about every 2-3 hours to go to the bathroom, even if you do not feel the need to do so. Take time to empty your bladder completely. After urinating, wait a minute. Then try to urinate again.  For causes involving nerve dysfunction, keep a log of the medicines you take and a journal of the times you go to the bathroom. SEEK MEDICAL CARE IF:  You experience worsening of pain instead of improvement in pain after your procedure.  Your incontinence becomes   worse instead of better. SEE IMMEDIATE MEDICAL CARE IF:  You experience fever or shaking chills.  You are unable to pass your urine.  You have redness spreading into your groin or down into your thighs. MAKE SURE YOU:  ?Understand these instructions.   Will watch your condition.  Will get help right away if you are not doing well or get worse. This information is not intended to replace advice given to you by your health care provider. Make sure you discuss any questions you have with your health care provider. Document Released: 05/01/2004 Document Revised: 04/14/2014 Document Reviewed: 08/31/2012 Elsevier Interactive Patient Education  2017 Elsevier Inc.  

## 2016-05-15 NOTE — Progress Notes (Signed)
56 y.o. G1P1001 MarriedCaucasianF here for annual exam.  No bleeding. She has some issues with orgasms, better by decreasing her lexapro to 2.5 mg a day. Plans to stop. No dyspareunia.   She has a h/o mixed urinary incontinence, she had a sling, helped some. Leaks more on the way to the bathroom. Leaks small amounts every day, wears a liner. Tolerable.   Patient's last menstrual period was 11/06/2011.          Sexually active: Yes.    The current method of family planning is post menopausal status.    Exercising: No.  The patient does not participate in regular exercise at present. Smoker:  no  Health Maintenance: Pap:  04-27-15 ASCUS NEG HR HPV/ 01-03-13 WNL NEG HR HPV History of abnormal Pap:  yes MMG:  03-21-16 WNL  Colonoscopy:  2014 polyps (she will double check if she is due now or next year) BMD:   Never TDaP:  2006  Gardasil: N/A   reports that she has never smoked. She has never used smokeless tobacco. She reports that she drinks about 3.0 oz of alcohol per week . She reports that she does not use drugs. 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 2 daughters, youngest is adopted. Old[place] in [place], youngest in college, in [place] studying. She was on infertility drugs for a long time.   Past Medical History:  Diagnosis Date  . Acid reflux   . Anxiety   . 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 Depression   . Fibromyalgia   . Hypercholesteremia 1999  . Hypertension 2005  . Restless leg syndrome 2003  . Urinary incontinence    tx'd with [facility]    Past Surgical History:  Procedure Laterality Date  . BLADDER SUSPENSION  2010  . BUNIONECTOMY Left 04/15/2016  . DIAGNOSTIC LAPAROSCOPY  1994    Normal  . TUBAL LIGATION  2004    Current Outpatient Prescriptions  Medication Sig Dispense Refill  . acyclovir (ZOVIRAX) 400 MG tablet Take 400 mg by mouth 5 (five) times daily. As needed    . B Complex Vitamins (B COMPLEX PO) Take by mouth.    Marland Kitchen. buPROPion (WELLBUTRIN XL)  300 MG 24 hr tablet Take 300 mg by mouth daily.    . Calcium-Magnesium-Vitamin D (CALCIUM MAGNESIUM PO) Take by mouth. Patient takes 1200 mg    . carvedilol (COREG) 25 MG tablet TK 1 T PO BID  2  . clonazePAM (KLONOPIN) 1 MG tablet Take 1 mg by mouth 2 (two) times daily as needed for anxiety.    Marland Kitchen. escitalopram (LEXAPRO) 5 MG tablet Take 5 mg by mouth daily.    Marland Kitchen. FOLIC ACID PO Take by mouth.    . hydrochlorothiazide (HYDRODIURIL) 25 MG tablet Take 25 mg by mouth daily.    Marland Kitchen. LYSINE PO Take by mouth as needed.    . Omega-3 Fatty Acids (OMEGA 3 PO) Take by mouth.    . Ranitidine HCl (ZANTAC PO) Take by mouth as needed.    . simvastatin (ZOCOR) 10 MG tablet Take 10 mg by mouth daily.     No current facility-administered medications for this visit.     Family History  Problem Relation Age of Onset  . Hypertension Mother   . Depression Mother   . Osteoporosis Mother [id number]  . Hypertension Father   . Depression Father   . Hypertension Maternal Grandmother   . Hypertension Maternal Grandfather   . Depression Paternal Grandmother  Review of Systems  Constitutional: Negative.   HENT: Negative.   Eyes: Negative.   Respiratory: Negative.   Cardiovascular: Negative.   Gastrointestinal: Negative.   Endocrine: Negative.   Genitourinary: Negative.   Musculoskeletal: Negative.   Skin: Negative.   Allergic/Immunologic: Negative.   Neurological: Negative.   Psychiatric/Behavioral: Negative.     Exam:   BP 122/62 (BP Location: Right Arm, Patient Position: Sitting, Cuff Size: Normal)   Pulse 80   Resp 14   Ht 5' 5.5" (1.664 m)   Wt 154 lb (69.9 kg)   LMP 11/06/2011   BMI 25.24 kg/m   Weight change: @WEIGHTCHANGE @ Height:   Height: 5' 5.5" (166.4 cm)  Ht Readings from Last 3 Encounters:  05/15/16 5' 5.5" (1.664 m)  04/27/15 5' 5.5" (1.664 m)  01/09/14 5' 5.5" (1.664 m)    General appearance: alert, cooperative and appears stated age Head: Normocephalic, without obvious abnormality,  atraumatic Neck: no adenopathy, supple, symmetrical, trachea midline and thyroid normal to inspection and palpation Lungs: clear to auscultation bilaterally Cardiovascular: regular rate and rhythm Breasts: normal appearance, no masses or tenderness Heart: regular rate and rhythm Abdomen: soft, non-tender; bowel sounds normal; no masses,  no organomegaly Extremities: extremities normal, atraumatic, no cyanosis or edema Skin: Skin color, texture, turgor normal. No rashes or lesions Lymph nodes: Cervical, supraclavicular, and axillary nodes normal. No abnormal inguinal nodes palpated Neurologic: Grossly normal   Pelvic: External genitalia:  no lesions              Urethra:  normal appearing urethra with no masses, tenderness or lesions              Bartholins and Skenes: normal                 Vagina: normal appearing vagina with normal color and discharge, no lesions              Cervix: no lesions               Bimanual Exam:  Uterus:  normal size, contour, position, consistency, mobility, non-tender and anteverted              Adnexa: no mass, fullness, tenderness               Rectovaginal: Confirms               Anus:  normal sphincter tone, no lesions  Chaperone was present for exam.  A:  Well Woman with normal exam  Mixed urinary incontinence, s/p surgery  P:   No pap this year  Mammogram UTD  Colonoscopy, she will check if it is due  Labs and immunizations with primary MD  Will send to PT for urinary incontinence. Discussed option of medication

## 2016-06-04 DIAGNOSIS — N3941 Urge incontinence: Secondary | ICD-10-CM | POA: Diagnosis not present

## 2016-06-04 DIAGNOSIS — F411 Generalized anxiety disorder: Secondary | ICD-10-CM | POA: Diagnosis not present

## 2016-06-04 DIAGNOSIS — N393 Stress incontinence (female) (male): Secondary | ICD-10-CM | POA: Diagnosis not present

## 2016-06-04 DIAGNOSIS — F331 Major depressive disorder, recurrent, moderate: Secondary | ICD-10-CM | POA: Diagnosis not present

## 2016-06-04 DIAGNOSIS — M6281 Muscle weakness (generalized): Secondary | ICD-10-CM | POA: Diagnosis not present

## 2016-06-04 DIAGNOSIS — R278 Other lack of coordination: Secondary | ICD-10-CM | POA: Diagnosis not present

## 2016-06-06 ENCOUNTER — Ambulatory Visit (INDEPENDENT_AMBULATORY_CARE_PROVIDER_SITE_OTHER): Payer: Self-pay | Admitting: Podiatry

## 2016-06-06 ENCOUNTER — Ambulatory Visit (INDEPENDENT_AMBULATORY_CARE_PROVIDER_SITE_OTHER): Payer: Self-pay

## 2016-06-06 DIAGNOSIS — Z9889 Other specified postprocedural states: Secondary | ICD-10-CM

## 2016-06-06 DIAGNOSIS — M21619 Bunion of unspecified foot: Secondary | ICD-10-CM

## 2016-06-08 NOTE — Progress Notes (Signed)
Subjective:     Patient ID: Heather [name], female   DOB: 1960-07-10, 56 y.o.   MRN: [id number]  HPI patient states she's doing real well with her left foot with good alignment noted and no discomfort with good range of motion   Review of Systems     Objective:   Physical Exam Neurovascular status intact negative Homans sign was noted with patient's left foot doing well with wound edges well coapted and good alignment with excellent range of motion    Assessment:     Doing well post distal osteotomy left    Plan:     H&P condition reviewed and recommended range of motion exercises gradual increase in activity levels and still being careful about 2 much activity. Reappoint to recheck in 6-8 weeks  X-ray report indicates the osteotomy is healing well fixation in place with no signs of pathology

## 2016-06-13 DIAGNOSIS — E785 Hyperlipidemia, unspecified: Secondary | ICD-10-CM | POA: Diagnosis not present

## 2016-06-13 DIAGNOSIS — H6981 Other specified disorders of Eustachian tube, right ear: Secondary | ICD-10-CM | POA: Diagnosis not present

## 2016-06-13 DIAGNOSIS — F4323 Adjustment disorder with mixed anxiety and depressed mood: Secondary | ICD-10-CM | POA: Diagnosis not present

## 2016-06-13 DIAGNOSIS — I10 Essential (primary) hypertension: Secondary | ICD-10-CM | POA: Diagnosis not present

## 2016-06-25 DIAGNOSIS — N3941 Urge incontinence: Secondary | ICD-10-CM | POA: Diagnosis not present

## 2016-06-25 DIAGNOSIS — M6281 Muscle weakness (generalized): Secondary | ICD-10-CM | POA: Diagnosis not present

## 2016-06-25 DIAGNOSIS — N393 Stress incontinence (female) (male): Secondary | ICD-10-CM | POA: Diagnosis not present

## 2016-07-22 DIAGNOSIS — Z8371 Family history of colonic polyps: Secondary | ICD-10-CM | POA: Diagnosis not present

## 2016-07-22 DIAGNOSIS — Z1211 Encounter for screening for malignant neoplasm of colon: Secondary | ICD-10-CM | POA: Diagnosis not present

## 2016-07-24 ENCOUNTER — Ambulatory Visit (INDEPENDENT_AMBULATORY_CARE_PROVIDER_SITE_OTHER): Payer: BLUE CROSS/BLUE SHIELD

## 2016-07-24 ENCOUNTER — Ambulatory Visit (INDEPENDENT_AMBULATORY_CARE_PROVIDER_SITE_OTHER): Payer: Self-pay | Admitting: Podiatry

## 2016-07-24 ENCOUNTER — Encounter: Payer: Self-pay | Admitting: Podiatry

## 2016-07-24 DIAGNOSIS — M21619 Bunion of unspecified foot: Secondary | ICD-10-CM | POA: Diagnosis not present

## 2016-07-24 DIAGNOSIS — Z9889 Other specified postprocedural states: Secondary | ICD-10-CM | POA: Diagnosis not present

## 2016-07-24 DIAGNOSIS — F331 Major depressive disorder, recurrent, moderate: Secondary | ICD-10-CM | POA: Diagnosis not present

## 2016-07-24 DIAGNOSIS — F411 Generalized anxiety disorder: Secondary | ICD-10-CM | POA: Diagnosis not present

## 2016-07-25 NOTE — Progress Notes (Signed)
Subjective:     Patient ID: Heather [name], female   DOB: 1961/02/25, 56 y.o.   MRN: [id number]  HPI patient presents stating that my bunion on my left is doing great and I'm very happy with the surgery   Review of Systems     Objective:   Physical Exam Neurovascular status intact negative Homans sign noted with well structured first metatarsal good range of motion and no crepitus of the joint surface    Assessment:     Doing well post osteotomy first metatarsal left    Plan:     Reviewed final x-rays allow patient to return to normal activity and reappoint as needed  X-ray indicates that the osteotomy is healing well pins in place no movement and joint congruence

## 2016-10-22 DIAGNOSIS — J069 Acute upper respiratory infection, unspecified: Secondary | ICD-10-CM | POA: Diagnosis not present

## 2016-11-04 DIAGNOSIS — D225 Melanocytic nevi of trunk: Secondary | ICD-10-CM | POA: Diagnosis not present

## 2016-11-04 DIAGNOSIS — L821 Other seborrheic keratosis: Secondary | ICD-10-CM | POA: Diagnosis not present

## 2016-11-04 DIAGNOSIS — D1800 Hemangioma unspecified site: Secondary | ICD-10-CM | POA: Diagnosis not present

## 2016-12-03 DIAGNOSIS — F411 Generalized anxiety disorder: Secondary | ICD-10-CM | POA: Diagnosis not present

## 2016-12-03 DIAGNOSIS — F331 Major depressive disorder, recurrent, moderate: Secondary | ICD-10-CM | POA: Diagnosis not present

## 2016-12-18 DIAGNOSIS — Z23 Encounter for immunization: Secondary | ICD-10-CM | POA: Diagnosis not present

## 2016-12-18 DIAGNOSIS — I10 Essential (primary) hypertension: Secondary | ICD-10-CM | POA: Diagnosis not present

## 2016-12-18 DIAGNOSIS — J302 Other seasonal allergic rhinitis: Secondary | ICD-10-CM | POA: Diagnosis not present

## 2016-12-18 DIAGNOSIS — E785 Hyperlipidemia, unspecified: Secondary | ICD-10-CM | POA: Diagnosis not present

## 2017-01-20 DIAGNOSIS — R3989 Other symptoms and signs involving the genitourinary system: Secondary | ICD-10-CM | POA: Diagnosis not present

## 2017-01-20 DIAGNOSIS — N3000 Acute cystitis without hematuria: Secondary | ICD-10-CM | POA: Diagnosis not present

## 2017-04-15 DIAGNOSIS — F411 Generalized anxiety disorder: Secondary | ICD-10-CM | POA: Diagnosis not present

## 2017-04-15 DIAGNOSIS — F331 Major depressive disorder, recurrent, moderate: Secondary | ICD-10-CM | POA: Diagnosis not present

## 2017-04-20 ENCOUNTER — Other Ambulatory Visit: Payer: Self-pay | Admitting: Obstetrics and Gynecology

## 2017-04-20 DIAGNOSIS — Z1231 Encounter for screening mammogram for malignant neoplasm of breast: Secondary | ICD-10-CM

## 2017-05-12 ENCOUNTER — Ambulatory Visit
Admission: RE | Admit: 2017-05-12 | Discharge: 2017-05-12 | Disposition: A | Discharge status: Home / Self Care | Payer: BLUE CROSS/BLUE SHIELD | Source: Ambulatory Visit | Attending: Obstetrics and Gynecology | Admitting: Obstetrics and Gynecology

## 2017-05-12 DIAGNOSIS — Z1231 Encounter for screening mammogram for malignant neoplasm of breast: Secondary | ICD-10-CM | POA: Diagnosis not present

## 2017-05-21 ENCOUNTER — Other Ambulatory Visit: Payer: Self-pay

## 2017-05-21 ENCOUNTER — Encounter: Payer: Self-pay | Admitting: Obstetrics and Gynecology

## 2017-05-21 ENCOUNTER — Ambulatory Visit: Payer: BLUE CROSS/BLUE SHIELD | Admitting: Obstetrics and Gynecology

## 2017-05-21 VITALS — BP 138/82 | HR 72 | Resp 14 | Ht 65.5 in | Wt 156.0 lb

## 2017-05-21 DIAGNOSIS — Z01419 Encounter for gynecological examination (general) (routine) without abnormal findings: Secondary | ICD-10-CM

## 2017-05-21 MED ORDER — VALACYCLOVIR HCL 1 G PO TABS: ORAL_TABLET | ORAL | 1 refills | Status: DC

## 2017-05-21 NOTE — Progress Notes (Signed)
57 y.o. G1P1001 MarriedCaucasianF here for annual exam.   She went to PT for her mixed incontinence and it is better. Tolerable.  No vaginal bleeding. No dyspareunia.     Patient's last menstrual period was 11/06/2011.          Sexually active: Yes.    The current method of family planning is post menopausal status.    Exercising: Yes.    weight training  Smoker:  no  Health Maintenance: Pap:  04-27-15 ASCUS NEG HR HPV 01-03-13 WNL NEG HR HPV  History of abnormal Pap:  Yes - + HPV  years ago  MMG:  05-12-17 WNL  Colonoscopy:  2014 polyps, told to f/u in 5-7 years  BMD:   Never TDaP:  Up to date (PCP)  Gardasil: N/A   reports that  has never smoked. she has never used smokeless tobacco. She reports that she drinks about 3.0 - 4.2 oz of alcohol per week. She reports that she does not use drugs. 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 Married to her husband for 10 years. 2 daughters, youngest is adopted. 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 Corliss Parish[place]oungest is a Holiday representativesenior in college, studying sustainable agriculture.   Past Medical History:  Diagnosis Date  . Acid reflux   . Anxiety   . 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 Depression   . Fibromyalgia   . Hypercholesteremia 1999  . Hypertension 2005  . Restless leg syndrome 2003  . Urinary incontinence    tx'd with [facility]    Past Surgical History:  Procedure Laterality Date  . BLADDER SUSPENSION  2010  . BUNIONECTOMY Left 04/15/2016  . DIAGNOSTIC LAPAROSCOPY  1994    Normal  . TUBAL LIGATION  2004    Current Outpatient Medications  Medication Sig Dispense Refill  . acyclovir (ZOVIRAX) 400 MG tablet Take 400 mg by mouth 5 (five) times daily. As needed    . B Complex Vitamins (B COMPLEX PO) Take by mouth.    Marland Kitchen. buPROPion (WELLBUTRIN XL) 300 MG 24 hr tablet Take 300 mg by mouth daily.    . Calcium-Magnesium-Vitamin D (CALCIUM MAGNESIUM PO) Take by mouth. Patient takes 1200 mg    . carvedilol (COREG) 25  MG tablet TK 1 T PO BID  2  . escitalopram (LEXAPRO) 5 MG tablet Take 5 mg by mouth daily.    Marland Kitchen. FOLIC ACID PO Take by mouth.    . hydrochlorothiazide (HYDRODIURIL) 25 MG tablet Take 25 mg by mouth daily.    Marland Kitchen. LYSINE PO Take by mouth as needed.    . Omega-3 Fatty Acids (OMEGA 3 PO) Take by mouth.    . Ranitidine HCl (ZANTAC PO) Take by mouth as needed.    . simvastatin (ZOCOR) 10 MG tablet Take 10 mg by mouth daily.    Marland Kitchen. gabapentin (NEURONTIN) 400 MG capsule TK 1 C PO QHS  1   No current facility-administered medications for this visit.     Family History  Problem Relation Age of Onset  . Hypertension Mother   . Depression Mother   . Osteoporosis Mother 2460  . Hypertension Father   . Depression Father   . Hypertension Maternal Grandmother   . Hypertension Maternal Grandfather   . Depression Paternal Grandmother     Review of Systems  Constitutional: Negative.   HENT: Negative.   Eyes: Negative.   Respiratory: Negative.   Cardiovascular: Negative.   Gastrointestinal: Negative.  Endocrine: Negative.   Genitourinary: Negative.   Musculoskeletal: Negative.   Skin: Negative.   Allergic/Immunologic: Negative.   Neurological: Negative.   Psychiatric/Behavioral: Negative.     Exam:   BP 138/82 (BP Location: Right Arm, Patient Position: Sitting, Cuff Size: Normal)   Pulse 72   Resp 14   Ht 5' 5.5" (1.664 m)   Wt 156 lb (70.8 kg)   LMP 11/06/2011   BMI 25.56 kg/m   Weight change: @WEIGHTCHANGE @ Height:   Height: 5' 5.5" (166.4 cm)  Ht Readings from Last 3 Encounters:  05/21/17 5' 5.5" (1.664 m)  05/15/16 5' 5.5" (1.664 m)  04/27/15 5' 5.5" (1.664 m)    General appearance: alert, cooperative and appears stated age Head: Normocephalic, without obvious abnormality, atraumatic Neck: no adenopathy, supple, symmetrical, trachea midline and thyroid normal to inspection and palpation Lungs: clear to auscultation bilaterally Cardiovascular: regular rate and rhythm Breasts:  normal appearance, no masses or tenderness Abdomen: soft, non-tender; non distended,  no masses,  no organomegaly Extremities: extremities normal, atraumatic, no cyanosis or edema Skin: Skin color, texture, turgor normal. No rashes or lesions Lymph nodes: Cervical, supraclavicular, and axillary nodes normal. No abnormal inguinal nodes palpated Neurologic: Grossly normal   Pelvic: External genitalia:  no lesions              Urethra:  normal appearing urethra with no masses, tenderness or lesions              Bartholins and Skenes: normal                 Vagina: normal appearing vagina with normal color and discharge, no lesions              Cervix: no lesions               Bimanual Exam:  Uterus:  normal size, contour, position, consistency, mobility, non-tender              Adnexa: no mass, fullness, tenderness               Rectovaginal: Confirms               Anus:  normal sphincter tone, no lesions  Chaperone was present for exam.  A:  Well Woman with normal exam  Occasional oral HSV outbreaks, Valtrex script sent to the pharmacy  P:   Labs with primary  Pap next year  Mammogram UTD  Colonoscopy, due in the next 2 years  Discussed breast self exam  Discussed calcium and vit D intake

## 2017-05-21 NOTE — Patient Instructions (Signed)

## 2017-06-19 DIAGNOSIS — E785 Hyperlipidemia, unspecified: Secondary | ICD-10-CM | POA: Diagnosis not present

## 2017-06-19 DIAGNOSIS — I10 Essential (primary) hypertension: Secondary | ICD-10-CM | POA: Diagnosis not present

## 2017-08-05 DIAGNOSIS — F411 Generalized anxiety disorder: Secondary | ICD-10-CM | POA: Diagnosis not present

## 2017-08-05 DIAGNOSIS — F331 Major depressive disorder, recurrent, moderate: Secondary | ICD-10-CM | POA: Diagnosis not present

## 2017-11-05 DIAGNOSIS — L814 Other melanin hyperpigmentation: Secondary | ICD-10-CM | POA: Diagnosis not present

## 2017-11-05 DIAGNOSIS — D225 Melanocytic nevi of trunk: Secondary | ICD-10-CM | POA: Diagnosis not present

## 2017-11-05 DIAGNOSIS — L821 Other seborrheic keratosis: Secondary | ICD-10-CM | POA: Diagnosis not present

## 2017-11-05 DIAGNOSIS — D1800 Hemangioma unspecified site: Secondary | ICD-10-CM | POA: Diagnosis not present

## 2017-12-03 DIAGNOSIS — F411 Generalized anxiety disorder: Secondary | ICD-10-CM | POA: Diagnosis not present

## 2017-12-03 DIAGNOSIS — F331 Major depressive disorder, recurrent, moderate: Secondary | ICD-10-CM | POA: Diagnosis not present

## 2018-01-15 DIAGNOSIS — Z23 Encounter for immunization: Secondary | ICD-10-CM | POA: Diagnosis not present

## 2018-01-15 DIAGNOSIS — I10 Essential (primary) hypertension: Secondary | ICD-10-CM | POA: Diagnosis not present

## 2018-01-15 DIAGNOSIS — E785 Hyperlipidemia, unspecified: Secondary | ICD-10-CM | POA: Diagnosis not present

## 2018-03-19 DIAGNOSIS — I10 Essential (primary) hypertension: Secondary | ICD-10-CM | POA: Diagnosis not present

## 2018-03-19 DIAGNOSIS — E785 Hyperlipidemia, unspecified: Secondary | ICD-10-CM | POA: Diagnosis not present

## 2018-03-24 DIAGNOSIS — F331 Major depressive disorder, recurrent, moderate: Secondary | ICD-10-CM | POA: Diagnosis not present

## 2018-03-24 DIAGNOSIS — F411 Generalized anxiety disorder: Secondary | ICD-10-CM | POA: Diagnosis not present

## 2018-05-13 DIAGNOSIS — E785 Hyperlipidemia, unspecified: Secondary | ICD-10-CM | POA: Diagnosis not present

## 2018-05-13 DIAGNOSIS — I10 Essential (primary) hypertension: Secondary | ICD-10-CM | POA: Diagnosis not present

## 2018-06-14 ENCOUNTER — Other Ambulatory Visit: Payer: Self-pay | Admitting: Obstetrics and Gynecology

## 2018-06-14 DIAGNOSIS — Z1231 Encounter for screening mammogram for malignant neoplasm of breast: Secondary | ICD-10-CM

## 2018-06-14 NOTE — Progress Notes (Signed)
58 y.o. G53P1001 Married White or Caucasian Not Hispanic or Latino female here for annual exam.   No vaginal bleeding. No dyspareunia. Bladder is okay, she has seen PT for mixed incontinence. Rare GSI. No bowel c/o.    Patient's last menstrual period was 11/06/2011.          Sexually active: Yes.    The current method of family planning is post menopausal status.    Exercising: Yes.    work out with trainer Smoker:  no  Health Maintenance: Pap:  04-27-15 ASCUS NEG HR HPV,  01-03-13 WNL NEG HR HPV  History of abnormal Pap:  Yes - + HPV  years ago  MMG:  05-12-17 WNL, scheduled   Colonoscopy:  2014 polyps, due BMD:   Never TDaP:  Up to date (PCP) Gardasil: N/A   reports that she has never smoked. She has never used smokeless tobacco. She reports current alcohol use of about 7.0 standard drinks of alcohol per week. She reports that she does not use drugs. 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 Married to her husband for 12 years. 2 grown daughters, youngest is adopted. 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 Younger daughter graduated from college last year and is working as a Social worker in [place] as her boyfriend finishes grad school.   Past Medical History:  Diagnosis Date  . Acid reflux   . Anxiety   . 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 Depression   . Fibromyalgia   . Hypercholesteremia 1999  . Hypertension 2005  . Restless leg syndrome 2003  . Urinary incontinence    tx'd with [facility]    Past Surgical History:  Procedure Laterality Date  . BLADDER SUSPENSION  2010  . BUNIONECTOMY Left 04/15/2016  . DIAGNOSTIC LAPAROSCOPY  1994    Normal  . TUBAL LIGATION  2004    Current Outpatient Medications  Medication Sig Dispense Refill  . B Complex Vitamins (B COMPLEX PO) Take by mouth.    Marland Kitchen buPROPion (WELLBUTRIN XL) 300 MG 24 hr tablet Take 300 mg by mouth daily.    . carvedilol (COREG) 25 MG tablet TK 1 T PO BID  2  . escitalopram (LEXAPRO) 5 MG  tablet Take 5 mg by mouth daily.    Marland Kitchen FOLIC ACID PO Take by mouth.    . gabapentin (NEURONTIN) 400 MG capsule TK 1 C PO QHS  1  . hydrochlorothiazide (HYDRODIURIL) 25 MG tablet Take 25 mg by mouth daily.    . Omega-3 Fatty Acids (OMEGA 3 PO) Take by mouth.    . Ranitidine HCl (ZANTAC PO) Take by mouth as needed.    . simvastatin (ZOCOR) 10 MG tablet Take 10 mg by mouth daily.    . valACYclovir (VALTREX) 1000 MG tablet Take 2 tablets BID for one day 30 tablet 1  . Calcium-Magnesium-Vitamin D (CALCIUM MAGNESIUM PO) Take by mouth. Patient takes 1200 mg    . terazosin (HYTRIN) 2 MG capsule Take 1 capsule by mouth daily.     No current facility-administered medications for this visit.     Family History  Problem Relation Age of Onset  . Hypertension Mother   . Depression Mother   . Osteoporosis Mother 78  . Hypertension Father   . Depression Father   . Hypertension Maternal Grandmother   . Hypertension Maternal Grandfather   . Depression Paternal Grandmother   . Breast cancer Maternal Aunt 48  died in her 39's  Mom is one of 7 girls, only one with breast cancer.   Review of Systems  Constitutional: Negative.   HENT: Negative.   Eyes: Negative.   Respiratory: Negative.   Cardiovascular: Negative.   Gastrointestinal: Negative.   Endocrine: Negative.   Genitourinary: Negative.   Musculoskeletal: Negative.   Skin: Negative.   Allergic/Immunologic: Negative.   Neurological: Negative.   Hematological: Negative.   Psychiatric/Behavioral: Negative.     Exam:   BP (!) 142/90 (BP Location: Right Arm, Patient Position: Sitting, Cuff Size: Normal)   Pulse 72   Ht 5\' 5"  (1.651 m)   Wt 157 lb 12.8 oz (71.6 kg)   LMP 11/06/2011   BMI 26.26 kg/m   Weight change: @WEIGHTCHANGE @ Height:   Height: 5\' 5"  (165.1 cm)  Ht Readings from Last 3 Encounters:  06/16/18 5\' 5"  (1.651 m)  05/21/17 5' 5.5" (1.664 m)  05/15/16 5' 5.5" (1.664 m)    General appearance: alert, cooperative and  appears stated age Head: Normocephalic, without obvious abnormality, atraumatic Neck: no adenopathy, supple, symmetrical, trachea midline and thyroid normal to inspection and palpation Lungs: clear to auscultation bilaterally Cardiovascular: regular rate and rhythm Breasts: normal appearance, no masses or tenderness Abdomen: soft, non-tender; non distended,  no masses,  no organomegaly Extremities: extremities normal, atraumatic, no cyanosis or edema Skin: Skin color, texture, turgor normal. No rashes or lesions Lymph nodes: Cervical, supraclavicular, and axillary nodes normal. No abnormal inguinal nodes palpated Neurologic: Grossly normal   Pelvic: External genitalia:  no lesions              Urethra:  normal appearing urethra with no masses, tenderness or lesions              Bartholins and Skenes: normal                 Vagina: normal appearing vagina with normal color and discharge, no lesions              Cervix: no lesions               Bimanual Exam:  Uterus:  normal size, contour, position, consistency, mobility, non-tender              Adnexa: no mass, fullness, tenderness               Rectovaginal: Confirms               Anus:  normal sphincter tone, no lesions  Chaperone was present for exam.  A:  Well Woman with normal exam  P:   Pap with hpv  Discussed breast self exam  Discussed calcium and vit D intake  Mammogram is scheduled 3d (she is adding the u/s secondary to her h/o dense breast)  Labs with primary  Will refer her for colonoscopy (will refer to a new GI)

## 2018-06-16 ENCOUNTER — Ambulatory Visit: Payer: BLUE CROSS/BLUE SHIELD | Admitting: Obstetrics and Gynecology

## 2018-06-16 ENCOUNTER — Other Ambulatory Visit (HOSPITAL_COMMUNITY)
Admission: RE | Admit: 2018-06-16 | Discharge: 2018-06-16 | Disposition: A | Discharge status: Home / Self Care | Payer: BLUE CROSS/BLUE SHIELD | Source: Ambulatory Visit | Attending: Obstetrics and Gynecology | Admitting: Obstetrics and Gynecology

## 2018-06-16 ENCOUNTER — Encounter: Payer: Self-pay | Admitting: Obstetrics and Gynecology

## 2018-06-16 ENCOUNTER — Other Ambulatory Visit: Payer: Self-pay

## 2018-06-16 VITALS — BP 142/90 | HR 72 | Ht 65.0 in | Wt 157.8 lb

## 2018-06-16 DIAGNOSIS — Z01419 Encounter for gynecological examination (general) (routine) without abnormal findings: Secondary | ICD-10-CM | POA: Diagnosis not present

## 2018-06-16 DIAGNOSIS — Z124 Encounter for screening for malignant neoplasm of cervix: Secondary | ICD-10-CM

## 2018-06-16 DIAGNOSIS — Z1211 Encounter for screening for malignant neoplasm of colon: Secondary | ICD-10-CM | POA: Diagnosis not present

## 2018-06-16 MED ORDER — VALACYCLOVIR HCL 1 G PO TABS: ORAL_TABLET | ORAL | 1 refills | Status: DC

## 2018-06-16 NOTE — Patient Instructions (Signed)

## 2018-06-17 DIAGNOSIS — H10413 Chronic giant papillary conjunctivitis, bilateral: Secondary | ICD-10-CM | POA: Diagnosis not present

## 2018-06-18 LAB — CYTOLOGY - PAP
Diagnosis: NEGATIVE
HPV: NOT DETECTED

## 2018-06-22 DIAGNOSIS — F331 Major depressive disorder, recurrent, moderate: Secondary | ICD-10-CM | POA: Diagnosis not present

## 2018-06-22 DIAGNOSIS — F411 Generalized anxiety disorder: Secondary | ICD-10-CM | POA: Diagnosis not present

## 2018-07-27 DIAGNOSIS — I10 Essential (primary) hypertension: Secondary | ICD-10-CM | POA: Diagnosis not present

## 2018-08-17 ENCOUNTER — Other Ambulatory Visit: Payer: Self-pay

## 2018-08-17 ENCOUNTER — Ambulatory Visit
Admission: RE | Admit: 2018-08-17 | Discharge: 2018-08-17 | Disposition: A | Discharge status: Home / Self Care | Payer: BLUE CROSS/BLUE SHIELD | Source: Ambulatory Visit | Attending: Obstetrics and Gynecology | Admitting: Obstetrics and Gynecology

## 2018-08-17 DIAGNOSIS — Z1231 Encounter for screening mammogram for malignant neoplasm of breast: Secondary | ICD-10-CM | POA: Diagnosis not present

## 2018-08-26 DIAGNOSIS — R300 Dysuria: Secondary | ICD-10-CM | POA: Diagnosis not present

## 2018-10-07 DIAGNOSIS — F331 Major depressive disorder, recurrent, moderate: Secondary | ICD-10-CM | POA: Diagnosis not present

## 2018-10-07 DIAGNOSIS — F411 Generalized anxiety disorder: Secondary | ICD-10-CM | POA: Diagnosis not present

## 2018-10-19 DIAGNOSIS — F331 Major depressive disorder, recurrent, moderate: Secondary | ICD-10-CM | POA: Diagnosis not present

## 2018-10-19 DIAGNOSIS — F411 Generalized anxiety disorder: Secondary | ICD-10-CM | POA: Diagnosis not present

## 2018-10-25 DIAGNOSIS — H903 Sensorineural hearing loss, bilateral: Secondary | ICD-10-CM | POA: Insufficient documentation

## 2018-10-25 DIAGNOSIS — H9193 Unspecified hearing loss, bilateral: Secondary | ICD-10-CM | POA: Diagnosis not present

## 2018-11-04 DIAGNOSIS — F331 Major depressive disorder, recurrent, moderate: Secondary | ICD-10-CM | POA: Diagnosis not present

## 2018-11-04 DIAGNOSIS — F411 Generalized anxiety disorder: Secondary | ICD-10-CM | POA: Diagnosis not present

## 2018-11-11 DIAGNOSIS — D225 Melanocytic nevi of trunk: Secondary | ICD-10-CM | POA: Diagnosis not present

## 2018-11-11 DIAGNOSIS — L821 Other seborrheic keratosis: Secondary | ICD-10-CM | POA: Diagnosis not present

## 2018-11-11 DIAGNOSIS — L814 Other melanin hyperpigmentation: Secondary | ICD-10-CM | POA: Diagnosis not present

## 2018-11-11 DIAGNOSIS — Z86018 Personal history of other benign neoplasm: Secondary | ICD-10-CM | POA: Diagnosis not present

## 2018-11-16 DIAGNOSIS — F331 Major depressive disorder, recurrent, moderate: Secondary | ICD-10-CM | POA: Diagnosis not present

## 2018-11-16 DIAGNOSIS — F411 Generalized anxiety disorder: Secondary | ICD-10-CM | POA: Diagnosis not present

## 2018-11-24 DIAGNOSIS — F411 Generalized anxiety disorder: Secondary | ICD-10-CM | POA: Diagnosis not present

## 2018-11-24 DIAGNOSIS — F331 Major depressive disorder, recurrent, moderate: Secondary | ICD-10-CM | POA: Diagnosis not present

## 2018-12-22 DIAGNOSIS — F331 Major depressive disorder, recurrent, moderate: Secondary | ICD-10-CM | POA: Diagnosis not present

## 2018-12-22 DIAGNOSIS — F411 Generalized anxiety disorder: Secondary | ICD-10-CM | POA: Diagnosis not present

## 2019-01-02 ENCOUNTER — Encounter: Payer: Self-pay | Admitting: Obstetrics and Gynecology

## 2019-01-03 ENCOUNTER — Telehealth: Payer: Self-pay | Admitting: Obstetrics and Gynecology

## 2019-01-03 DIAGNOSIS — Z1211 Encounter for screening for malignant neoplasm of colon: Secondary | ICD-10-CM

## 2019-01-03 NOTE — Telephone Encounter (Signed)
Non-Urgent Medical Question Received: Yesterday Message Contents  [name], [name] sent to [facility]  Phone Number: [phone]        [name],  You mentioned the name of an gastroenterologist who I should call and schedule my next colon screening but I do not remember the name. I am not sure if you were going to give me a referral or if I should call and schedule an initial consultation. Please advise.  Thank you,  Heather [name]

## 2019-01-03 NOTE — Telephone Encounter (Signed)
Call to patient. Advised patient new referral placed to Dr. Celesta Aver office for Colonoscopy. Patient advised referral coordinator would be in touch regarding scheduling. Patient agreeable.   Routing to provider and will close encounter.   Heather [name]

## 2019-01-05 DIAGNOSIS — F411 Generalized anxiety disorder: Secondary | ICD-10-CM | POA: Diagnosis not present

## 2019-01-05 DIAGNOSIS — F331 Major depressive disorder, recurrent, moderate: Secondary | ICD-10-CM | POA: Diagnosis not present

## 2019-01-13 DIAGNOSIS — F331 Major depressive disorder, recurrent, moderate: Secondary | ICD-10-CM | POA: Diagnosis not present

## 2019-01-13 DIAGNOSIS — F411 Generalized anxiety disorder: Secondary | ICD-10-CM | POA: Diagnosis not present

## 2019-01-20 DIAGNOSIS — F331 Major depressive disorder, recurrent, moderate: Secondary | ICD-10-CM | POA: Diagnosis not present

## 2019-01-20 DIAGNOSIS — F411 Generalized anxiety disorder: Secondary | ICD-10-CM | POA: Diagnosis not present

## 2019-01-25 DIAGNOSIS — E785 Hyperlipidemia, unspecified: Secondary | ICD-10-CM | POA: Diagnosis not present

## 2019-01-25 DIAGNOSIS — I10 Essential (primary) hypertension: Secondary | ICD-10-CM | POA: Diagnosis not present

## 2019-02-02 ENCOUNTER — Encounter: Payer: Self-pay | Admitting: Obstetrics and Gynecology

## 2019-02-02 DIAGNOSIS — F411 Generalized anxiety disorder: Secondary | ICD-10-CM | POA: Diagnosis not present

## 2019-02-02 DIAGNOSIS — F331 Major depressive disorder, recurrent, moderate: Secondary | ICD-10-CM | POA: Diagnosis not present

## 2019-02-16 DIAGNOSIS — F331 Major depressive disorder, recurrent, moderate: Secondary | ICD-10-CM | POA: Diagnosis not present

## 2019-02-16 DIAGNOSIS — F411 Generalized anxiety disorder: Secondary | ICD-10-CM | POA: Diagnosis not present

## 2019-03-16 DIAGNOSIS — F331 Major depressive disorder, recurrent, moderate: Secondary | ICD-10-CM | POA: Diagnosis not present

## 2019-03-16 DIAGNOSIS — F411 Generalized anxiety disorder: Secondary | ICD-10-CM | POA: Diagnosis not present

## 2019-04-13 DIAGNOSIS — M7711 Lateral epicondylitis, right elbow: Secondary | ICD-10-CM | POA: Diagnosis not present

## 2019-04-21 DIAGNOSIS — F411 Generalized anxiety disorder: Secondary | ICD-10-CM | POA: Diagnosis not present

## 2019-04-21 DIAGNOSIS — F331 Major depressive disorder, recurrent, moderate: Secondary | ICD-10-CM | POA: Diagnosis not present

## 2019-04-27 DIAGNOSIS — F331 Major depressive disorder, recurrent, moderate: Secondary | ICD-10-CM | POA: Diagnosis not present

## 2019-04-27 DIAGNOSIS — F411 Generalized anxiety disorder: Secondary | ICD-10-CM | POA: Diagnosis not present

## 2019-05-11 DIAGNOSIS — F411 Generalized anxiety disorder: Secondary | ICD-10-CM | POA: Diagnosis not present

## 2019-05-11 DIAGNOSIS — F331 Major depressive disorder, recurrent, moderate: Secondary | ICD-10-CM | POA: Diagnosis not present

## 2019-05-31 ENCOUNTER — Other Ambulatory Visit: Payer: Self-pay | Admitting: Obstetrics and Gynecology

## 2019-05-31 MED ORDER — VALACYCLOVIR HCL 1 G PO TABS: ORAL_TABLET | ORAL | 1 refills | Status: DC

## 2019-05-31 NOTE — Telephone Encounter (Signed)
Refill request for valacyclovir. [facility] on [address]

## 2019-05-31 NOTE — Telephone Encounter (Signed)
Medication refill request: Valacyclovir Last AEX:  06/16/18 JJ Next AEX: 07/14/19  Last MMG (if hormonal medication request): 08/17/18 BIRADS 1 negative Refill authorized: Please advise on refill; order pended #30 w/1 refill if authorized.

## 2019-06-22 DIAGNOSIS — F411 Generalized anxiety disorder: Secondary | ICD-10-CM | POA: Diagnosis not present

## 2019-06-22 DIAGNOSIS — F331 Major depressive disorder, recurrent, moderate: Secondary | ICD-10-CM | POA: Diagnosis not present

## 2019-07-12 NOTE — Progress Notes (Signed)
59 y.o. G34P1001 Married White or Caucasian Not Hispanic or Latino female here for annual exam.  She says that she has left breast tenderness, it has come and gone over years. Occurs in either lateral breast. No change.  No vaginal bleeding. No dyspareunia.   H/O mixed incontinence, has seen PT.     Patient's last menstrual period was 11/06/2011.          Sexually active: Yes.    The current method of family planning is post menopausal status.    Exercising: Yes.    weight bearing x3 walking  Smoker:  no  Health Maintenance: Pap:  06/16/2018 Normal HPV Neg, -20-17 ASCUS NEG HR HPV History of abnormal Pap:  Yes + hpv  MMG:  08/17/18 density c Bi-rads 1 neg  BMD:   Never  Colonoscopy:2014 polyps, overdue. She will call.   TDaP:  Up to date (PCP)    reports that she has never smoked. She has never used smokeless tobacco. She reports current alcohol use of about 7.0 standard drinks of alcohol per week. She reports that she does not use drugs. 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   Past Medical History:  Diagnosis Date  . Acid reflux   . Anxiety   . 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 Depression   . Fibromyalgia   . Hypercholesteremia 1999  . Hypertension 2005  . Restless leg syndrome 2003  . Urinary incontinence    tx'd with [facility]    Past Surgical History:  Procedure Laterality Date  . BLADDER SUSPENSION  2010  . BUNIONECTOMY Left 04/15/2016  . DIAGNOSTIC LAPAROSCOPY  1994    Normal  . TUBAL LIGATION  2004    Current Outpatient Medications  Medication Sig Dispense Refill  . B Complex Vitamins (B COMPLEX PO) Take by mouth.    Marland Kitchen buPROPion (WELLBUTRIN XL) 300 MG 24 hr tablet Take 300 mg by mouth daily.    . Calcium-Magnesium-Vitamin D (CALCIUM MAGNESIUM PO) Take by mouth. Patient takes 1200 mg    . carvedilol (COREG) 25 MG tablet TK 1 T PO BID  2  . escitalopram (LEXAPRO) 5 MG tablet Take 5 mg by mouth daily.    Marland Kitchen FOLIC ACID PO Take  by mouth.    . gabapentin (NEURONTIN) 400 MG capsule TK 1 C PO QHS  1  . hydrochlorothiazide (HYDRODIURIL) 25 MG tablet Take 25 mg by mouth daily.    . Omega-3 Fatty Acids (OMEGA 3 PO) Take by mouth.    . Ranitidine HCl (ZANTAC PO) Take by mouth as needed.    . simvastatin (ZOCOR) 10 MG tablet Take 10 mg by mouth daily.    Marland Kitchen terazosin (HYTRIN) 2 MG capsule Take 1 capsule by mouth daily.    . valACYclovir (VALTREX) 1000 MG tablet Take 2 tablets BID for one day 30 tablet 1   No current facility-administered medications for this visit.    Family History  Problem Relation Age of Onset  . Hypertension Mother   . Depression Mother   . Osteoporosis Mother 57  . Hypertension Father   . Depression Father   . Hypertension Maternal Grandmother   . Hypertension Maternal Grandfather   . Depression Paternal Grandmother   . Breast cancer Maternal Aunt 48       died in her 50's    Review of Systems  Constitutional: Negative.   HENT: Positive for mouth sores.   Eyes: Negative.  Respiratory: Negative.   Cardiovascular: Negative.   Gastrointestinal: Negative.   Musculoskeletal: Negative.   Skin: Negative.   Allergic/Immunologic: Positive for environmental allergies.  Neurological: Negative.   Hematological: Negative.   Psychiatric/Behavioral: Negative.     Exam:   BP 130/77   Pulse 75   Temp 97.7 F (36.5 C)   Ht 5\' 5"  (1.651 m)   Wt 153 lb (69.4 kg)   LMP 11/06/2011   SpO2 93%   BMI 25.46 kg/m   Weight change: @WEIGHTCHANGE @ Height:   Height: 5\' 5"  (165.1 cm)  Ht Readings from Last 3 Encounters:  07/14/19 5\' 5"  (1.651 m)  06/16/18 5\' 5"  (1.651 m)  05/21/17 5' 5.5" (1.664 m)    General appearance: alert, cooperative and appears stated age Head: Normocephalic, without obvious abnormality, atraumatic Neck: no adenopathy, supple, symmetrical, trachea midline and thyroid normal to inspection and palpation Lungs: clear to auscultation bilaterally Cardiovascular: regular rate  and rhythm Breasts: normal appearance, no masses or tenderness Abdomen: soft, non-tender; non distended,  no masses,  no organomegaly Extremities: extremities normal, atraumatic, no cyanosis or edema Skin: Skin color, texture, turgor normal. No rashes or lesions Lymph nodes: Cervical, supraclavicular, and axillary nodes normal. No abnormal inguinal nodes palpated Neurologic: Grossly normal   Pelvic: External genitalia:  no lesions              Urethra:  normal appearing urethra with no masses, tenderness or lesions              Bartholins and Skenes: normal                 Vagina: mildly atrophic appearing vagina with normal color and discharge, no lesions              Cervix: no lesions               Bimanual Exam:  Uterus:  normal size, contour, position, consistency, mobility, non-tender              Adnexa: no mass, fullness, tenderness               Rectovaginal: Confirms               Anus:  normal sphincter tone, no lesions  Karmen Bongo chaperoned for the exam.  A:  Well Woman with normal exam  Long term issues with intermittent bilateral breast pain, no change. She has large breasts, we discussed making sure her bras are supporting her well. Information on breast tenderness given.   P:   Pap UTD  Mammogram next month  She will schedule her colonoscopy  Discussed breast self exam  Discussed calcium and vit D intake  Labs with primary

## 2019-07-13 ENCOUNTER — Other Ambulatory Visit: Payer: Self-pay

## 2019-07-14 ENCOUNTER — Ambulatory Visit: Payer: BC Managed Care – PPO | Admitting: Obstetrics and Gynecology

## 2019-07-14 ENCOUNTER — Encounter: Payer: Self-pay | Admitting: Obstetrics and Gynecology

## 2019-07-14 VITALS — BP 130/77 | HR 75 | Temp 97.7°F | Ht 65.0 in | Wt 153.0 lb

## 2019-07-14 DIAGNOSIS — Z01419 Encounter for gynecological examination (general) (routine) without abnormal findings: Secondary | ICD-10-CM | POA: Diagnosis not present

## 2019-07-14 MED ORDER — VALACYCLOVIR HCL 1 G PO TABS: ORAL_TABLET | ORAL | 1 refills | Status: DC

## 2019-07-14 NOTE — Patient Instructions (Signed)
EXERCISE AND DIET:  We recommended that you start or continue a regular exercise program for good health. Regular exercise means any activity that makes your heart beat faster and makes you sweat.  We recommend exercising at least 30 minutes per day at least 3 days a week, preferably 4 or 5.  We also recommend a diet low in fat and sugar.  Inactivity, poor dietary choices and obesity can cause diabetes, heart attack, stroke, and kidney damage, among others.    ALCOHOL AND SMOKING:  Women should limit their alcohol intake to no more than 7 drinks/beers/glasses of wine (combined, not each!) per week. Moderation of alcohol intake to this level decreases your risk of breast cancer and liver damage. And of course, no recreational drugs are part of a healthy lifestyle.  And absolutely no smoking or even second hand smoke. Most people know smoking can cause heart and lung diseases, but did you know it also contributes to weakening of your bones? Aging of your skin?  Yellowing of your teeth and nails?  CALCIUM AND VITAMIN D:  Adequate intake of calcium and Vitamin D are recommended.  The recommendations for exact amounts of these supplements seem to change often, but generally speaking 1,200 mg of calcium (between diet and supplement) and 800 units of Vitamin D per day seems prudent. Certain women may benefit from higher intake of Vitamin D.  If you are among these women, your doctor will have told you during your visit.    PAP SMEARS:  Pap smears, to check for cervical cancer or precancers,  have traditionally been done yearly, although recent scientific advances have shown that most women can have pap smears less often.  However, every woman still should have a physical exam from her gynecologist every year. It will include a breast check, inspection of the vulva and vagina to check for abnormal growths or skin changes, a visual exam of the cervix, and then an exam to evaluate the size and shape of the uterus and  ovaries.  And after 57 years of age, a rectal exam is indicated to check for rectal cancers. We will also provide age appropriate advice regarding health maintenance, like when you should have certain vaccines, screening for sexually transmitted diseases, bone density testing, colonoscopy, mammograms, etc.   MAMMOGRAMS:  All women over 40 years old should have a yearly mammogram. Many facilities now offer a "3D" mammogram, which may cost around $50 extra out of pocket. If possible,  we recommend you accept the option to have the 3D mammogram performed.  It both reduces the number of women who will be called back for extra views which then turn out to be normal, and it is better than the routine mammogram at detecting truly abnormal areas.    COLON CANCER SCREENING: Now recommend starting at age 45. At this time colonoscopy is not covered for routine screening until 50. There are take home tests that can be done between 45-49.   COLONOSCOPY:  Colonoscopy to screen for colon cancer is recommended for all women at age 50.  We know, you hate the idea of the prep.  We agree, BUT, having colon cancer and not knowing it is worse!!  Colon cancer so often starts as a polyp that can be seen and removed at colonscopy, which can quite literally save your life!  And if your first colonoscopy is normal and you have no family history of colon cancer, most women don't have to have it again for   10 years.  Once every ten years, you can do something that may end up saving your life, right?  We will be happy to help you get it scheduled when you are ready.  Be sure to check your insurance coverage so you understand how much it will cost.  It may be covered as a preventative service at no cost, but you should check your particular policy.      Breast Self-Awareness Breast self-awareness means being familiar with how your breasts look and feel. It involves checking your breasts regularly and reporting any changes to your  health care provider. Practicing breast self-awareness is important. A change in your breasts can be a sign of a serious medical problem. Being familiar with how your breasts look and feel allows you to find any problems early, when treatment is more likely to be successful. All women should practice breast self-awareness, including women who have had breast implants. How to do a breast self-exam One way to learn what is normal for your breasts and whether your breasts are changing is to do a breast self-exam. To do a breast self-exam: Look for Changes  1. Remove all the clothing above your waist. 2. Stand in front of a mirror in a room with good lighting. 3. Put your hands on your hips. 4. Push your hands firmly downward. 5. Compare your breasts in the mirror. Look for differences between them (asymmetry), such as: ? Differences in shape. ? Differences in size. ? Puckers, dips, and bumps in one breast and not the other. 6. Look at each breast for changes in your skin, such as: ? Redness. ? Scaly areas. 7. Look for changes in your nipples, such as: ? Discharge. ? Bleeding. ? Dimpling. ? Redness. ? A change in position. Feel for Changes Carefully feel your breasts for lumps and changes. It is best to do this while lying on your back on the floor and again while sitting or standing in the shower or tub with soapy water on your skin. Feel each breast in the following way:  Place the arm on the side of the breast you are examining above your head.  Feel your breast with the other hand.  Start in the nipple area and make  inch (2 cm) overlapping circles to feel your breast. Use the pads of your three middle fingers to do this. Apply light pressure, then medium pressure, then firm pressure. The light pressure will allow you to feel the tissue closest to the skin. The medium pressure will allow you to feel the tissue that is a little deeper. The firm pressure will allow you to feel the tissue  close to the ribs.  Continue the overlapping circles, moving downward over the breast until you feel your ribs below your breast.  Move one finger-width toward the center of the body. Continue to use the  inch (2 cm) overlapping circles to feel your breast as you move slowly up toward your collarbone.  Continue the up and down exam using all three pressures until you reach your armpit.  Write Down What You Find  Write down what is normal for each breast and any changes that you find. Keep a written record with breast changes or normal findings for each breast. By writing this information down, you do not need to depend only on memory for size, tenderness, or location. Write down where you are in your menstrual cycle, if you are still menstruating. If you are having trouble noticing differences   in your breasts, do not get discouraged. With time you will become more familiar with the variations in your breasts and more comfortable with the exam. How often should I examine my breasts? Examine your breasts every month. If you are breastfeeding, the best time to examine your breasts is after a feeding or after using a breast pump. If you menstruate, the best time to examine your breasts is 5-7 days after your period is over. During your period, your breasts are lumpier, and it may be more difficult to notice changes. When should I see my health care provider? See your health care provider if you notice:  A change in shape or size of your breasts or nipples.  A change in the skin of your breast or nipples, such as a reddened or scaly area.  Unusual discharge from your nipples.  A lump or thick area that was not there before.  Pain in your breasts.  Anything that concerns you.  Breast Tenderness Breast tenderness is a common problem for women of all ages, but may also occur in men. Breast tenderness may range from mild discomfort to severe pain. In women, the pain usually comes and goes with  the menstrual cycle, but it can also be constant. Breast tenderness has many possible causes, including hormone changes, infections, and taking certain medicines. You may have tests, such as a mammogram or an ultrasound, to check for any unusual findings. Having breast tenderness usually does not mean that you have breast cancer. Follow these instructions at home: Managing pain and discomfort   If directed, put ice to the painful area. To do this: ? Put ice in a plastic bag. ? Place a towel between your skin and the bag. ? Leave the ice on for 20 minutes, 2-3 times a day.  Wear a supportive bra, especially during exercise. You may also want to wear a supportive bra while sleeping if your breasts are very tender. Medicines  Take over-the-counter and prescription medicines only as told by your health care provider. If the cause of your pain is infection, you may be prescribed an antibiotic medicine.  If you were prescribed an antibiotic, take it as told by your health care provider. Do not stop taking the antibiotic even if you start to feel better. Eating and drinking  Your health care provider may recommend that you lessen the amount of fat in your diet. You can do this by: ? Limiting fried foods. ? Cooking foods using methods such as baking, boiling, grilling, and broiling.  Decrease the amount of caffeine in your diet. Instead, drink more water and choose caffeine-free drinks. General instructions   Keep a log of the days and times when your breasts are most tender.  Ask your health care provider how to do breast exams at home. This will help you notice if you have an unusual growth or lump.  Keep all follow-up visits as told by your health care provider. This is important. Contact a health care provider if:  Any part of your breast is hard, red, and hot to the touch. This may be a sign of infection.  You are a woman and: ? Not breastfeeding and you have fluid, especially blood  or pus, coming out of your nipples. ? Have a new or painful lump in your breast that remains after your menstrual period ends.  You have a fever.  Your pain does not improve or it gets worse.  Your pain is interfering with your daily   activities. Summary  Breast tenderness may range from mild discomfort to severe pain.  Breast tenderness has many possible causes, including hormone changes, infections, and taking certain medicines.  It can be treated with ice, wearing a supportive bra, and medicines.  Make changes to your diet if told to by your health care provider. This information is not intended to replace advice given to you by your health care provider. Make sure you discuss any questions you have with your health care provider. Document Revised: 08/16/2018 Document Reviewed: 08/16/2018 Elsevier Patient Education  2020 [employer]  

## 2019-07-18 ENCOUNTER — Other Ambulatory Visit: Payer: Self-pay | Admitting: Obstetrics and Gynecology

## 2019-07-18 DIAGNOSIS — Z1231 Encounter for screening mammogram for malignant neoplasm of breast: Secondary | ICD-10-CM

## 2019-07-20 DIAGNOSIS — F411 Generalized anxiety disorder: Secondary | ICD-10-CM | POA: Diagnosis not present

## 2019-07-20 DIAGNOSIS — F331 Major depressive disorder, recurrent, moderate: Secondary | ICD-10-CM | POA: Diagnosis not present

## 2019-08-18 ENCOUNTER — Ambulatory Visit: Payer: BLUE CROSS/BLUE SHIELD

## 2019-08-22 ENCOUNTER — Other Ambulatory Visit: Payer: Self-pay | Admitting: Obstetrics and Gynecology

## 2019-08-22 DIAGNOSIS — Z1231 Encounter for screening mammogram for malignant neoplasm of breast: Secondary | ICD-10-CM

## 2019-08-29 ENCOUNTER — Ambulatory Visit
Admission: RE | Admit: 2019-08-29 | Discharge: 2019-08-29 | Disposition: A | Discharge status: Home / Self Care | Payer: BC Managed Care – PPO | Source: Ambulatory Visit | Attending: Obstetrics and Gynecology | Admitting: Obstetrics and Gynecology

## 2019-08-29 ENCOUNTER — Other Ambulatory Visit: Payer: Self-pay

## 2019-08-29 DIAGNOSIS — Z1231 Encounter for screening mammogram for malignant neoplasm of breast: Secondary | ICD-10-CM

## 2019-10-13 DIAGNOSIS — R6884 Jaw pain: Secondary | ICD-10-CM | POA: Diagnosis not present

## 2019-10-13 DIAGNOSIS — E785 Hyperlipidemia, unspecified: Secondary | ICD-10-CM | POA: Diagnosis not present

## 2019-10-13 DIAGNOSIS — R030 Elevated blood-pressure reading, without diagnosis of hypertension: Secondary | ICD-10-CM | POA: Diagnosis not present

## 2019-10-13 DIAGNOSIS — I10 Essential (primary) hypertension: Secondary | ICD-10-CM | POA: Diagnosis not present

## 2019-10-18 DIAGNOSIS — F331 Major depressive disorder, recurrent, moderate: Secondary | ICD-10-CM | POA: Diagnosis not present

## 2019-10-18 DIAGNOSIS — F411 Generalized anxiety disorder: Secondary | ICD-10-CM | POA: Diagnosis not present

## 2019-11-17 ENCOUNTER — Telehealth: Payer: Self-pay

## 2019-11-17 NOTE — Telephone Encounter (Signed)
Rec'd

## 2019-11-23 ENCOUNTER — Telehealth: Payer: Self-pay | Admitting: Internal Medicine

## 2019-11-23 NOTE — Telephone Encounter (Signed)
Hey Dr. Leone Payor, patient is being referred for a colonoscopy. Last colonoscopy was in 2015. She is requesting you as her doctor. I have the records and I will send them up to you for review. Please advise on scheduling. Thank you!

## 2019-11-25 NOTE — Telephone Encounter (Signed)
Records reviewed  The polyps removed from her rectum were NOT pre-cancerous.  There is some mention of a family history of colon polyps vs cance.  I do not see that documented in our records.  1) IF does have a family history of coon cancer in a parent or sibling that was <60 around time of diagnosis then she should repeat a colonoscopy now.  2) IF no to # 1 then I would not repeat a colonoscopy until about 04/2023. I think from what I see that the family history was likely erroneous/mistake.  Let me know if any ?'s

## 2019-11-28 NOTE — Telephone Encounter (Signed)
Left message for patient to call back  

## 2019-11-28 NOTE — Telephone Encounter (Signed)
Spoke with pt, she does not have family history of colon cancer and agrees to repeat colonoscopy around 04/2023

## 2019-11-30 DIAGNOSIS — D225 Melanocytic nevi of trunk: Secondary | ICD-10-CM | POA: Diagnosis not present

## 2019-11-30 DIAGNOSIS — Z86018 Personal history of other benign neoplasm: Secondary | ICD-10-CM | POA: Diagnosis not present

## 2019-11-30 DIAGNOSIS — L821 Other seborrheic keratosis: Secondary | ICD-10-CM | POA: Diagnosis not present

## 2019-11-30 DIAGNOSIS — L814 Other melanin hyperpigmentation: Secondary | ICD-10-CM | POA: Diagnosis not present

## 2020-01-11 DIAGNOSIS — F411 Generalized anxiety disorder: Secondary | ICD-10-CM | POA: Diagnosis not present

## 2020-01-11 DIAGNOSIS — F331 Major depressive disorder, recurrent, moderate: Secondary | ICD-10-CM | POA: Diagnosis not present

## 2020-02-14 DIAGNOSIS — E876 Hypokalemia: Secondary | ICD-10-CM | POA: Diagnosis not present

## 2020-02-14 DIAGNOSIS — E785 Hyperlipidemia, unspecified: Secondary | ICD-10-CM | POA: Diagnosis not present

## 2020-02-14 DIAGNOSIS — I10 Essential (primary) hypertension: Secondary | ICD-10-CM | POA: Diagnosis not present

## 2020-02-14 DIAGNOSIS — Z23 Encounter for immunization: Secondary | ICD-10-CM | POA: Diagnosis not present

## 2020-03-12 DIAGNOSIS — Z20822 Contact with and (suspected) exposure to covid-19: Secondary | ICD-10-CM | POA: Diagnosis not present

## 2020-03-15 DIAGNOSIS — Z20822 Contact with and (suspected) exposure to covid-19: Secondary | ICD-10-CM | POA: Diagnosis not present

## 2020-03-15 DIAGNOSIS — J029 Acute pharyngitis, unspecified: Secondary | ICD-10-CM | POA: Diagnosis not present

## 2020-03-15 DIAGNOSIS — J398 Other specified diseases of upper respiratory tract: Secondary | ICD-10-CM | POA: Diagnosis not present

## 2020-03-23 ENCOUNTER — Ambulatory Visit: Payer: BC Managed Care – PPO | Attending: Internal Medicine

## 2020-03-23 DIAGNOSIS — Z23 Encounter for immunization: Secondary | ICD-10-CM

## 2020-03-23 NOTE — Progress Notes (Signed)
   Covid-19 Vaccination [facility]  Name:  ASIANA BENNINGER    MRN: [id number] DOB: 06-02-1960  03/23/2020  Ms. Franzel was observed post Covid-19 immunization for 15 minutes without incident. She was provided with Vaccine Information Sheet and instruction to access the V-Safe system.   Ms. Kasinger was instructed to call 911 with any severe reactions post vaccine: Marland Kitchen Difficulty breathing  . Swelling of face and throat  . A fast heartbeat  . A bad rash all over body  . Dizziness and weakness   Immunizations Administered    Name Date Dose VIS Date Route   Pfizer COVID-19 Vaccine 03/23/2020  4:39 PM 0.3 mL 01/25/2020 Intramuscular   Manufacturer: [employer], [employer]   Lot: [id number]   NDC: [address]-9

## 2020-04-11 DIAGNOSIS — F331 Major depressive disorder, recurrent, moderate: Secondary | ICD-10-CM | POA: Diagnosis not present

## 2020-04-11 DIAGNOSIS — F411 Generalized anxiety disorder: Secondary | ICD-10-CM | POA: Diagnosis not present

## 2020-05-08 DIAGNOSIS — M25552 Pain in left hip: Secondary | ICD-10-CM | POA: Diagnosis not present

## 2020-05-08 DIAGNOSIS — M7062 Trochanteric bursitis, left hip: Secondary | ICD-10-CM | POA: Diagnosis not present

## 2020-06-18 DIAGNOSIS — M25552 Pain in left hip: Secondary | ICD-10-CM | POA: Diagnosis not present

## 2020-07-04 DIAGNOSIS — M25552 Pain in left hip: Secondary | ICD-10-CM | POA: Diagnosis not present

## 2020-07-10 DIAGNOSIS — M25552 Pain in left hip: Secondary | ICD-10-CM | POA: Diagnosis not present

## 2020-07-17 NOTE — Progress Notes (Signed)
60 y.o. G13P1001 Married White or Caucasian Not Hispanic or Latino female here for annual exam.  She had a CT about a week ago for her hip, incidental finding of a "mass" in her uterus. Will have a copy of the report sent here.   She has fray in her left labrum and tears in her Gluteus Maximum. She was given a steroid shot. He didn't recommend surgery, stated the results at her age were "ambiguous". This limits her ability to walk. Even causes pain with being in certain positions for intercourse.   No vaginal bleeding. No dyspareunia.   H/O mixed incontinence, symptoms are manageable since PT. Not leaking on a regular basis.     Patient's last menstrual period was 11/06/2011.          Sexually active: Yes.    The current method of family planning is status post hysterectomy.    Exercising: Yes.    Gym/ health club routine includes cardio. Smoker:  no  Health Maintenance: Pap:  06/16/2018 Normal HPV Neg, -20-17 ASCUS NEG HR HPV History of abnormal Pap:  Yes +HPV MMG:  08/29/19 Density C Bi-rads 1 neg  BMD:   None  Colonoscopy: 05/05/13 Polyps, f/u 10 years  TDaP:  Up to date with PCP  Gardasil:NA    reports that she has never smoked. She has never used smokeless tobacco. She reports current alcohol use of about 7.0 standard drinks of alcohol per week. She reports that she does not use drugs. 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   Past Medical History:  Diagnosis Date  . Acid reflux   . Anxiety   . 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 Depression   . Fibromyalgia   . Hypercholesteremia 1999  . Hypertension 2005  . Restless leg syndrome 2003  . Urinary incontinence    tx'd with [facility]    Past Surgical History:  Procedure Laterality Date  . BLADDER SUSPENSION  2010  . BUNIONECTOMY Left 04/15/2016  . DIAGNOSTIC LAPAROSCOPY  1994    Normal  . TUBAL LIGATION  2004    Current Outpatient Medications  Medication Sig Dispense Refill  . B  Complex Vitamins (B COMPLEX PO) Take by mouth.    Marland Kitchen buPROPion (WELLBUTRIN XL) 300 MG 24 hr tablet Take 300 mg by mouth daily.    . Calcium-Magnesium-Vitamin D (CALCIUM MAGNESIUM PO) Take by mouth. Patient takes 1200 mg    . carvedilol (COREG) 25 MG tablet TK 1 T PO BID  2  . escitalopram (LEXAPRO) 5 MG tablet Take 5 mg by mouth daily.    Marland Kitchen FOLIC ACID PO Take by mouth.    . gabapentin (NEURONTIN) 400 MG capsule TK 1 C PO QHS  1  . Omega-3 Fatty Acids (OMEGA 3 PO) Take by mouth.    . simvastatin (ZOCOR) 10 MG tablet Take 10 mg by mouth daily.    Marland Kitchen terazosin (HYTRIN) 2 MG capsule Take 1 capsule by mouth daily.    . valACYclovir (VALTREX) 1000 MG tablet Take 2 tablets BID for one day 30 tablet 1   No current facility-administered medications for this visit.    Family History  Problem Relation Age of Onset  . Hypertension Mother   . Depression Mother   . Osteoporosis Mother 85  . Hypertension Father   . Depression Father   . Hypertension Maternal Grandmother   . Hypertension Maternal Grandfather   . Depression Paternal Grandmother   .  Breast cancer Maternal Aunt 48       died in her 27's    Review of Systems  All other systems reviewed and are negative.   Exam:   BP 134/82   Pulse 77   Ht 5\' 6"  (1.676 m)   Wt 151 lb 12.8 oz (68.9 kg)   LMP 11/06/2011   SpO2 99%   BMI 24.50 kg/m   Weight change: @WEIGHTCHANGE @ Height:   Height: 5\' 6"  (167.6 cm)  Ht Readings from Last 3 Encounters:  07/18/20 5\' 6"  (1.676 m)  07/14/19 5\' 5"  (1.651 m)  06/16/18 5\' 5"  (1.651 m)    General appearance: alert, cooperative and appears stated age Head: Normocephalic, without obvious abnormality, atraumatic Neck: no adenopathy, supple, symmetrical, trachea midline and thyroid normal to inspection and palpation Lungs: clear to auscultation bilaterally Cardiovascular: regular rate and rhythm Breasts: normal appearance, no masses or tenderness Abdomen: soft, non-tender; non distended,  no masses,   no organomegaly Extremities: extremities normal, atraumatic, no cyanosis or edema Skin: Skin color, texture, turgor normal. No rashes or lesions Lymph nodes: Cervical, supraclavicular, and axillary nodes normal. No abnormal inguinal nodes palpated Neurologic: Grossly normal   Pelvic: External genitalia:  no lesions              Urethra:  normal appearing urethra with no masses, tenderness or lesions              Bartholins and Skenes: normal                 Vagina: normal appearing vagina with normal color and discharge, no lesions              Cervix: no lesions               Bimanual Exam:  Uterus:  normal size, contour, position, consistency, mobility, non-tender              Adnexa: no mass, fullness, tenderness               Rectovaginal: Confirms               Anus:  normal sphincter tone, no lesions  07/20/20 chaperoned for the exam.  1. Well woman exam Discussed breast self exam Discussed calcium and vit D intake Mammogram in May Colonoscopy UTD  2. Uterine mass Noted on MRI for her hip Await report

## 2020-07-18 ENCOUNTER — Other Ambulatory Visit: Payer: Self-pay

## 2020-07-18 ENCOUNTER — Ambulatory Visit: Payer: BC Managed Care – PPO | Admitting: Obstetrics and Gynecology

## 2020-07-18 ENCOUNTER — Encounter: Payer: Self-pay | Admitting: Obstetrics and Gynecology

## 2020-07-18 VITALS — BP 134/82 | HR 77 | Ht 66.0 in | Wt 151.8 lb

## 2020-07-18 DIAGNOSIS — Z01419 Encounter for gynecological examination (general) (routine) without abnormal findings: Secondary | ICD-10-CM | POA: Diagnosis not present

## 2020-07-18 DIAGNOSIS — N858 Other specified noninflammatory disorders of uterus: Secondary | ICD-10-CM

## 2020-07-18 DIAGNOSIS — G479 Sleep disorder, unspecified: Secondary | ICD-10-CM | POA: Insufficient documentation

## 2020-07-18 DIAGNOSIS — Z79899 Other long term (current) drug therapy: Secondary | ICD-10-CM | POA: Insufficient documentation

## 2020-07-18 NOTE — Patient Instructions (Signed)

## 2020-08-14 DIAGNOSIS — I10 Essential (primary) hypertension: Secondary | ICD-10-CM | POA: Diagnosis not present

## 2020-08-14 DIAGNOSIS — E785 Hyperlipidemia, unspecified: Secondary | ICD-10-CM | POA: Diagnosis not present

## 2020-08-22 DIAGNOSIS — F331 Major depressive disorder, recurrent, moderate: Secondary | ICD-10-CM | POA: Diagnosis not present

## 2020-08-22 DIAGNOSIS — F411 Generalized anxiety disorder: Secondary | ICD-10-CM | POA: Diagnosis not present

## 2020-10-01 DIAGNOSIS — L820 Inflamed seborrheic keratosis: Secondary | ICD-10-CM | POA: Diagnosis not present

## 2020-11-21 DIAGNOSIS — F331 Major depressive disorder, recurrent, moderate: Secondary | ICD-10-CM | POA: Diagnosis not present

## 2020-11-21 DIAGNOSIS — F411 Generalized anxiety disorder: Secondary | ICD-10-CM | POA: Diagnosis not present

## 2020-12-03 DIAGNOSIS — D225 Melanocytic nevi of trunk: Secondary | ICD-10-CM | POA: Diagnosis not present

## 2020-12-03 DIAGNOSIS — L814 Other melanin hyperpigmentation: Secondary | ICD-10-CM | POA: Diagnosis not present

## 2020-12-03 DIAGNOSIS — Z86018 Personal history of other benign neoplasm: Secondary | ICD-10-CM | POA: Diagnosis not present

## 2020-12-03 DIAGNOSIS — L821 Other seborrheic keratosis: Secondary | ICD-10-CM | POA: Diagnosis not present

## 2020-12-17 ENCOUNTER — Other Ambulatory Visit: Payer: Self-pay | Admitting: Obstetrics and Gynecology

## 2020-12-17 DIAGNOSIS — Z1231 Encounter for screening mammogram for malignant neoplasm of breast: Secondary | ICD-10-CM

## 2020-12-19 ENCOUNTER — Other Ambulatory Visit: Payer: Self-pay

## 2020-12-19 ENCOUNTER — Ambulatory Visit
Admission: RE | Admit: 2020-12-19 | Discharge: 2020-12-19 | Disposition: A | Discharge status: Home / Self Care | Payer: BC Managed Care – PPO | Source: Ambulatory Visit

## 2020-12-19 DIAGNOSIS — Z1231 Encounter for screening mammogram for malignant neoplasm of breast: Secondary | ICD-10-CM

## 2021-02-11 ENCOUNTER — Ambulatory Visit (INDEPENDENT_AMBULATORY_CARE_PROVIDER_SITE_OTHER): Payer: BC Managed Care – PPO

## 2021-02-11 ENCOUNTER — Other Ambulatory Visit: Payer: Self-pay

## 2021-02-11 ENCOUNTER — Ambulatory Visit (INDEPENDENT_AMBULATORY_CARE_PROVIDER_SITE_OTHER): Payer: BC Managed Care – PPO | Admitting: Podiatry

## 2021-02-11 DIAGNOSIS — M79672 Pain in left foot: Secondary | ICD-10-CM | POA: Diagnosis not present

## 2021-02-11 DIAGNOSIS — M722 Plantar fascial fibromatosis: Secondary | ICD-10-CM | POA: Diagnosis not present

## 2021-02-11 NOTE — Progress Notes (Signed)
G

## 2021-02-12 ENCOUNTER — Telehealth: Payer: Self-pay | Admitting: Urology

## 2021-02-12 ENCOUNTER — Other Ambulatory Visit: Payer: Self-pay | Admitting: Podiatry

## 2021-02-12 DIAGNOSIS — M722 Plantar fascial fibromatosis: Secondary | ICD-10-CM

## 2021-02-12 NOTE — Telephone Encounter (Signed)
DOS - 03/06/2021  REMOVAL FIXATION DEEP LEFT --- 20680   [employer] EFFECTIVE DATE - 04/07/20   PLAN DEDUCTIBLE - $0.00  OUT OF POCKET - $700.00 W/ $0.00 REMAINING COINSURANCE - 40% COPAY - $0.00   NO PRIOR AUTH REQUIRED

## 2021-02-13 NOTE — Progress Notes (Signed)
Subjective:   Patient ID: Heather [name], female   DOB: 60 y.o.   MRN: [id number]   HPI Patient presents stating that he has a knot on top of the left foot and states that she does not remember injury and states she has noticed it over the last few months.  Stated her surgery did great she is very happy with this and this is been a recent occurrence.  Patient does not smoke no changes in health history and likes to be active but is having trouble wearing certain shoe gear   Review of Systems  All other systems reviewed and are negative.      Objective:  Physical Exam Vitals and nursing note reviewed.  Constitutional:      Appearance: She is well-developed.  Pulmonary:     Effort: Pulmonary effort is normal.  Musculoskeletal:        General: Normal range of motion.  Skin:    General: Skin is warm.  Neurological:     Mental Status: She is alert.    Neurovascular status intact muscle strength adequate range of motion within normal limits.  Patient is found to have a protuberance on the dorsum of the left first metatarsal shaft it is painful when pressed and it is in the proximity of pin that was placed in 40 years ago with no proximal issues or other pathology with moderate discomfort when pressed.  Patient has good digital perfusion well oriented x3     Assessment:  Probability for prominent pin position left first metatarsal dorsal     Plan:  H&P reviewed condition and x-ray and educated her on the deformity and the possibility there also may be a small ganglion on around the area.  Patient is opted to have the pin removed due to pain and physical appearance of this and at this point I discussed the procedure and risk and she wants procedure signed consent form and is scheduled for office surgery.  Understand she will have several stitches and she will have to be careful with this for several weeks and risk does exist and she is willing to accept this and signed consent  form  X-ray indicates it appears the proximal pin may have popped out in a more proximal direction creating irritation of tissue

## 2021-02-21 DIAGNOSIS — F331 Major depressive disorder, recurrent, moderate: Secondary | ICD-10-CM | POA: Diagnosis not present

## 2021-02-21 DIAGNOSIS — F411 Generalized anxiety disorder: Secondary | ICD-10-CM | POA: Diagnosis not present

## 2021-02-22 DIAGNOSIS — E785 Hyperlipidemia, unspecified: Secondary | ICD-10-CM | POA: Diagnosis not present

## 2021-02-22 DIAGNOSIS — I10 Essential (primary) hypertension: Secondary | ICD-10-CM | POA: Diagnosis not present

## 2021-02-22 DIAGNOSIS — Z23 Encounter for immunization: Secondary | ICD-10-CM | POA: Diagnosis not present

## 2021-03-06 ENCOUNTER — Encounter: Payer: Self-pay | Admitting: Podiatry

## 2021-03-06 ENCOUNTER — Ambulatory Visit (INDEPENDENT_AMBULATORY_CARE_PROVIDER_SITE_OTHER): Payer: BC Managed Care – PPO | Admitting: Podiatry

## 2021-03-06 ENCOUNTER — Other Ambulatory Visit: Payer: Self-pay

## 2021-03-06 DIAGNOSIS — H6981 Other specified disorders of Eustachian tube, right ear: Secondary | ICD-10-CM | POA: Insufficient documentation

## 2021-03-06 DIAGNOSIS — R899 Unspecified abnormal finding in specimens from other organs, systems and tissues: Secondary | ICD-10-CM | POA: Insufficient documentation

## 2021-03-06 DIAGNOSIS — Z472 Encounter for removal of internal fixation device: Secondary | ICD-10-CM | POA: Diagnosis not present

## 2021-03-06 DIAGNOSIS — R600 Localized edema: Secondary | ICD-10-CM | POA: Insufficient documentation

## 2021-03-06 DIAGNOSIS — Z1211 Encounter for screening for malignant neoplasm of colon: Secondary | ICD-10-CM | POA: Insufficient documentation

## 2021-03-06 DIAGNOSIS — E785 Hyperlipidemia, unspecified: Secondary | ICD-10-CM | POA: Insufficient documentation

## 2021-03-06 DIAGNOSIS — F4323 Adjustment disorder with mixed anxiety and depressed mood: Secondary | ICD-10-CM | POA: Insufficient documentation

## 2021-03-06 DIAGNOSIS — J302 Other seasonal allergic rhinitis: Secondary | ICD-10-CM | POA: Insufficient documentation

## 2021-03-06 DIAGNOSIS — H6991 Unspecified Eustachian tube disorder, right ear: Secondary | ICD-10-CM | POA: Insufficient documentation

## 2021-03-06 DIAGNOSIS — R0789 Other chest pain: Secondary | ICD-10-CM | POA: Insufficient documentation

## 2021-03-06 DIAGNOSIS — M797 Fibromyalgia: Secondary | ICD-10-CM | POA: Insufficient documentation

## 2021-03-06 DIAGNOSIS — G43109 Migraine with aura, not intractable, without status migrainosus: Secondary | ICD-10-CM | POA: Insufficient documentation

## 2021-03-06 DIAGNOSIS — R748 Abnormal levels of other serum enzymes: Secondary | ICD-10-CM | POA: Insufficient documentation

## 2021-03-06 DIAGNOSIS — M21612 Bunion of left foot: Secondary | ICD-10-CM | POA: Insufficient documentation

## 2021-03-06 DIAGNOSIS — R000 Tachycardia, unspecified: Secondary | ICD-10-CM | POA: Insufficient documentation

## 2021-03-06 NOTE — Progress Notes (Signed)
Subjective:   Patient ID: Heather [name], female   DOB: 60 y.o.   MRN: [id number]   HPI Patient presents for removal of painful pad left first metatarsal that has become very irritated recently with surgery that was done over 4 years ago   ROS      Objective:  Physical Exam  Neurovascular status intact prominent pin left first metatarsal shaft with slight redness no drainage and painful     Assessment:  Abnormal pin position left first metatarsal     Plan:  H&P done went ahead and anesthetized 60 mg like Marcaine mixture patient was taken to the operating room sterile prep was done and tourniquet was inflated after exsanguination of the foot.  I exposed the first metatarsal shaft did a prep to the area and then utilizing sharp dissection I made an incision in the shaft first metatarsal approximate 3 cm length took it down through subcu tissue to the capsule and made a linear capsular incision.  Capsular tissue sharply dissected off the underlying bone revealing a prominent pin position which was removed in toto and the wound was then flushed copious amounts Derica Meissen solution and sutured with 5-0 nylon.  Sterile dressing applied tourniquet released capillary fill noted immediate to all digits on the left foot and patient left the operating room with a surgical shoe.  Reappoint 2 weeks suture removal earlier if needed

## 2021-03-13 ENCOUNTER — Telehealth: Payer: Self-pay | Admitting: *Deleted

## 2021-03-13 NOTE — Telephone Encounter (Signed)
Notified patient of physician's recommendations.She verbalized understanding.

## 2021-03-13 NOTE — Telephone Encounter (Signed)
Start soaking in epson salt and if not improved next couple of days we can call in antibiotic

## 2021-03-13 NOTE — Telephone Encounter (Signed)
Patient is calling because since having the pin removed one week ago, was doing well but has developed a puffiness,redness, soreness at incision site two days ago.Please advise.

## 2021-03-20 ENCOUNTER — Ambulatory Visit (INDEPENDENT_AMBULATORY_CARE_PROVIDER_SITE_OTHER): Payer: BC Managed Care – PPO

## 2021-03-20 ENCOUNTER — Encounter: Payer: BC Managed Care – PPO | Admitting: Podiatry

## 2021-03-20 ENCOUNTER — Other Ambulatory Visit: Payer: Self-pay

## 2021-03-20 ENCOUNTER — Encounter: Payer: Self-pay | Admitting: Podiatry

## 2021-03-20 ENCOUNTER — Ambulatory Visit (INDEPENDENT_AMBULATORY_CARE_PROVIDER_SITE_OTHER): Payer: BC Managed Care – PPO | Admitting: Podiatry

## 2021-03-20 DIAGNOSIS — Z9889 Other specified postprocedural states: Secondary | ICD-10-CM

## 2021-03-20 DIAGNOSIS — Z472 Encounter for removal of internal fixation device: Secondary | ICD-10-CM

## 2021-03-21 NOTE — Progress Notes (Signed)
Subjective:   Patient ID: Heather [name], female   DOB: 60 y.o.   MRN: [id number]   HPI Patient states she is doing very well with removal of pin from the left foot stating that she is having minimal discomfort near   ROS      Objective:  Physical Exam  Neurovascular status intact negative Denna Haggard' sign noted wound edges dorsal left healing well stitches intact no drainage     Assessment:  Doing well post pin removal left with stitches intact     Plan:  H&P reviewed condition stitches removed wound edges coapted well wear Band-Aids for the next few days is discharged but will be seen back as needed  X-rays indicate satisfactory removal of pin distal pin in place no pathology surrounded

## 2021-04-15 DIAGNOSIS — T1512XA Foreign body in conjunctival sac, left eye, initial encounter: Secondary | ICD-10-CM | POA: Diagnosis not present

## 2021-04-15 DIAGNOSIS — H04123 Dry eye syndrome of bilateral lacrimal glands: Secondary | ICD-10-CM | POA: Diagnosis not present

## 2021-04-25 DIAGNOSIS — H6692 Otitis media, unspecified, left ear: Secondary | ICD-10-CM | POA: Diagnosis not present

## 2021-04-25 DIAGNOSIS — H9312 Tinnitus, left ear: Secondary | ICD-10-CM | POA: Diagnosis not present

## 2021-04-29 DIAGNOSIS — H90A32 Mixed conductive and sensorineural hearing loss, unilateral, left ear with restricted hearing on the contralateral side: Secondary | ICD-10-CM | POA: Diagnosis not present

## 2021-04-29 DIAGNOSIS — J343 Hypertrophy of nasal turbinates: Secondary | ICD-10-CM | POA: Diagnosis not present

## 2021-04-29 DIAGNOSIS — H90A21 Sensorineural hearing loss, unilateral, right ear, with restricted hearing on the contralateral side: Secondary | ICD-10-CM | POA: Diagnosis not present

## 2021-04-29 DIAGNOSIS — H903 Sensorineural hearing loss, bilateral: Secondary | ICD-10-CM | POA: Diagnosis not present

## 2021-05-15 DIAGNOSIS — H9312 Tinnitus, left ear: Secondary | ICD-10-CM | POA: Diagnosis not present

## 2021-05-15 DIAGNOSIS — H9120 Sudden idiopathic hearing loss, unspecified ear: Secondary | ICD-10-CM | POA: Diagnosis not present

## 2021-05-20 ENCOUNTER — Other Ambulatory Visit: Payer: Self-pay | Admitting: Otolaryngology

## 2021-05-20 DIAGNOSIS — H9120 Sudden idiopathic hearing loss, unspecified ear: Secondary | ICD-10-CM

## 2021-05-27 ENCOUNTER — Ambulatory Visit
Admission: RE | Admit: 2021-05-27 | Discharge: 2021-05-27 | Disposition: A | Discharge status: Home / Self Care | Payer: BC Managed Care – PPO | Source: Ambulatory Visit | Attending: Otolaryngology | Admitting: Otolaryngology

## 2021-05-27 DIAGNOSIS — H9042 Sensorineural hearing loss, unilateral, left ear, with unrestricted hearing on the contralateral side: Secondary | ICD-10-CM | POA: Diagnosis not present

## 2021-05-27 DIAGNOSIS — H9312 Tinnitus, left ear: Secondary | ICD-10-CM | POA: Diagnosis not present

## 2021-05-27 DIAGNOSIS — H9120 Sudden idiopathic hearing loss, unspecified ear: Secondary | ICD-10-CM

## 2021-05-27 DIAGNOSIS — I6782 Cerebral ischemia: Secondary | ICD-10-CM | POA: Diagnosis not present

## 2021-05-27 MED ORDER — GADOBENATE DIMEGLUMINE 529 MG/ML IV SOLN
14.0000 mL | Freq: Once | INTRAVENOUS | Status: AC | PRN
  Administered 2021-05-27: 14 mL via INTRAVENOUS

## 2021-06-04 DIAGNOSIS — F331 Major depressive disorder, recurrent, moderate: Secondary | ICD-10-CM | POA: Diagnosis not present

## 2021-06-04 DIAGNOSIS — F411 Generalized anxiety disorder: Secondary | ICD-10-CM | POA: Diagnosis not present

## 2021-06-13 DIAGNOSIS — H9120 Sudden idiopathic hearing loss, unspecified ear: Secondary | ICD-10-CM | POA: Diagnosis not present

## 2021-06-13 DIAGNOSIS — H9312 Tinnitus, left ear: Secondary | ICD-10-CM | POA: Diagnosis not present

## 2021-07-11 DIAGNOSIS — F411 Generalized anxiety disorder: Secondary | ICD-10-CM | POA: Diagnosis not present

## 2021-07-11 DIAGNOSIS — F331 Major depressive disorder, recurrent, moderate: Secondary | ICD-10-CM | POA: Diagnosis not present

## 2021-07-17 NOTE — Progress Notes (Signed)
61 y.o. G80P1001 Married White or Caucasian Not Hispanic or Latino female here for annual exam.  No vaginal bleeding. Sexually active, no pain.  ?  ?H/o mixed incontinence. Improved after PT.  ? ?No bowel c/o.  ? ?She has had recent hearing loss in her left ear (caused by a virus). She has a constant sound of "crickets" in her left ear.  ? ?Patient's last menstrual period was 11/06/2011.          ?Sexually active: Yes.    ?The current method of family planning is post menopausal status.    ?Exercising: Yes.     Yoga ?Smoker:  no ? ?Health Maintenance: ?Pap:  06/16/2018 Normal HPV Neg, -20-17 ASCUS NEG HR HPV ?History of abnormal Pap:  yes +HPV, no h/o surgery on her cervix.  ?MMG:  12/19/20 Density C Bi-rads 1 neg  ?BMD:   n/a ?Colonoscopy: 05/05/13 Polyps, f/u 10 years  ?TDaP:  UPT with PCP  ?Gardasil: n/a ? ? reports that she has never smoked. She has never used smokeless tobacco. She reports current alcohol use of about 7.0 standard drinks per week. She reports that she does not use drugs. She is drinking about 2 drinks a day. 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  ? ?Past Medical History:  ?Diagnosis Date  ? Acid reflux   ? Anxiety   ? 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 Depression   ? Fibromyalgia   ? Hypercholesteremia 1999  ? Hypertension 2005  ? Restless leg syndrome 2003  ? Urinary incontinence   ? tx'd with [facility]  ? ? ?Past Surgical History:  ?Procedure Laterality Date  ? BLADDER SUSPENSION  2010  ? BUNIONECTOMY Left 04/15/2016  ? DIAGNOSTIC LAPAROSCOPY  1994   ? Normal  ? TUBAL LIGATION  2004  ? ? ?Current Outpatient Medications  ?Medication Sig Dispense Refill  ? ALPRAZolam (XANAX) 0.5 MG tablet 1 tablet    ? B Complex Vitamins (B COMPLEX PO) Take by mouth.    ? buPROPion (WELLBUTRIN XL) 300 MG 24 hr tablet Take 300 mg by mouth daily.    ? Calcium-Magnesium-Vitamin D (CALCIUM MAGNESIUM PO) Take by mouth. Patient takes 1200 mg    ? carvedilol (COREG) 25 MG tablet TK 1  T PO BID  2  ? escitalopram (LEXAPRO) 5 MG tablet Take 5 mg by mouth daily.    ? FOLIC ACID PO Take by mouth.    ? gabapentin (NEURONTIN) 400 MG capsule TK 1 C PO QHS  1  ? Omega-3 Fatty Acids (OMEGA 3 PO) Take by mouth.    ? simvastatin (ZOCOR) 10 MG tablet Take 10 mg by mouth daily.    ? terazosin (HYTRIN) 2 MG capsule Take 1 capsule by mouth daily.    ? triamterene-hydrochlorothiazide (DYAZIDE) 37.5-25 MG capsule Take 1 capsule by mouth every morning.    ? valACYclovir (VALTREX) 1000 MG tablet Take 2 tablets BID for one day 30 tablet 1  ? ?No current facility-administered medications for this visit.  ? ? ?Family History  ?Problem Relation Age of Onset  ? Hypertension Mother   ? Depression Mother   ? Osteoporosis Mother 1  ? Hypertension Father   ? Depression Father   ? Hypertension Maternal Grandmother   ? Hypertension Maternal Grandfather   ? Depression Paternal Grandmother   ? Breast cancer Maternal Aunt 25  ?     died in her 79's  ? ? ?Review of  Systems  ?All other systems reviewed and are negative. ? ?Exam:   ?BP 120/74   Pulse 72   Ht 5\' 6"  (1.676 m)   Wt 152 lb (68.9 kg)   LMP 11/06/2011   SpO2 99%   BMI 24.53 kg/m?   Weight change: @WEIGHTCHANGE @ Height:   Height: 5\' 6"  (167.6 cm)  ?Ht Readings from Last 3 Encounters:  ?07/24/21 5\' 6"  (1.676 m)  ?07/18/20 5\' 6"  (1.676 m)  ?07/14/19 5\' 5"  (1.651 m)  ? ? ?General appearance: alert, cooperative and appears stated age ?Head: Normocephalic, without obvious abnormality, atraumatic ?Neck: no adenopathy, supple, symmetrical, trachea midline and thyroid normal to inspection and palpation ?Lungs: clear to auscultation bilaterally ?Cardiovascular: regular rate and rhythm ?Breasts: normal appearance, no masses or tenderness ?Abdomen: soft, non-tender; non distended,  no masses,  no organomegaly ?Extremities: extremities normal, atraumatic, no cyanosis or edema ?Skin: Skin color, texture, turgor normal. No rashes or lesions ?Lymph nodes: Cervical,  supraclavicular, and axillary nodes normal. ?No abnormal inguinal nodes palpated ?Neurologic: Grossly normal ? ? ?Pelvic: External genitalia:  no lesions ?             Urethra:  normal appearing urethra with no masses, tenderness or lesions ?             Bartholins and Skenes: normal    ?             Vagina: normal appearing vagina with normal color and discharge, no lesions ?             Cervix: no lesions ?              ?Bimanual Exam:  Uterus:  normal size, contour, position, consistency, mobility, non-tender ?             Adnexa: no mass, fullness, tenderness ?              Rectovaginal: Confirms ?              Anus:  normal sphincter tone, no lesions ? ?Wandra Scot chaperoned for the exam. ? ?1. Well woman exam ?Discussed breast self exam ?Discussed calcium and vit D intake ?Labs with primary ?Mammogram due, she will schedule ?Colonoscopy UTD ? ?2. 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 ? ? ?

## 2021-07-24 ENCOUNTER — Ambulatory Visit (INDEPENDENT_AMBULATORY_CARE_PROVIDER_SITE_OTHER): Payer: BC Managed Care – PPO | Admitting: Obstetrics and Gynecology

## 2021-07-24 ENCOUNTER — Encounter: Payer: Self-pay | Admitting: Obstetrics and Gynecology

## 2021-07-24 VITALS — BP 120/74 | HR 72 | Ht 66.0 in | Wt 152.0 lb

## 2021-07-24 DIAGNOSIS — Z8619 Personal history of other infectious and parasitic diseases: Secondary | ICD-10-CM | POA: Diagnosis not present

## 2021-07-24 DIAGNOSIS — Z01419 Encounter for gynecological examination (general) (routine) without abnormal findings: Secondary | ICD-10-CM | POA: Diagnosis not present

## 2021-07-24 MED ORDER — VALACYCLOVIR HCL 1 G PO TABS: ORAL_TABLET | ORAL | 1 refills | Status: DC

## 2021-07-24 NOTE — Patient Instructions (Signed)

## 2021-07-30 DIAGNOSIS — H9122 Sudden idiopathic hearing loss, left ear: Secondary | ICD-10-CM | POA: Diagnosis not present

## 2021-08-01 DIAGNOSIS — H9122 Sudden idiopathic hearing loss, left ear: Secondary | ICD-10-CM | POA: Diagnosis not present

## 2021-08-06 DIAGNOSIS — H9122 Sudden idiopathic hearing loss, left ear: Secondary | ICD-10-CM | POA: Diagnosis not present

## 2021-08-08 DIAGNOSIS — H9122 Sudden idiopathic hearing loss, left ear: Secondary | ICD-10-CM | POA: Diagnosis not present

## 2021-08-20 ENCOUNTER — Other Ambulatory Visit: Payer: Self-pay | Admitting: Obstetrics and Gynecology

## 2021-08-20 DIAGNOSIS — Z1231 Encounter for screening mammogram for malignant neoplasm of breast: Secondary | ICD-10-CM

## 2021-08-23 DIAGNOSIS — J019 Acute sinusitis, unspecified: Secondary | ICD-10-CM | POA: Diagnosis not present

## 2021-08-30 DIAGNOSIS — H903 Sensorineural hearing loss, bilateral: Secondary | ICD-10-CM | POA: Diagnosis not present

## 2021-09-10 DIAGNOSIS — F411 Generalized anxiety disorder: Secondary | ICD-10-CM | POA: Diagnosis not present

## 2021-09-10 DIAGNOSIS — F331 Major depressive disorder, recurrent, moderate: Secondary | ICD-10-CM | POA: Diagnosis not present

## 2021-10-08 ENCOUNTER — Other Ambulatory Visit: Payer: Self-pay | Admitting: Obstetrics and Gynecology

## 2021-10-08 DIAGNOSIS — Z8619 Personal history of other infectious and parasitic diseases: Secondary | ICD-10-CM

## 2021-10-09 NOTE — Telephone Encounter (Signed)
Annual exam was 07/2021 

## 2021-10-16 DIAGNOSIS — I10 Essential (primary) hypertension: Secondary | ICD-10-CM | POA: Diagnosis not present

## 2021-10-16 DIAGNOSIS — F4323 Adjustment disorder with mixed anxiety and depressed mood: Secondary | ICD-10-CM | POA: Diagnosis not present

## 2021-10-16 DIAGNOSIS — E785 Hyperlipidemia, unspecified: Secondary | ICD-10-CM | POA: Diagnosis not present

## 2021-10-30 DIAGNOSIS — E785 Hyperlipidemia, unspecified: Secondary | ICD-10-CM | POA: Diagnosis not present

## 2021-10-30 DIAGNOSIS — I10 Essential (primary) hypertension: Secondary | ICD-10-CM | POA: Diagnosis not present

## 2021-11-28 DIAGNOSIS — F331 Major depressive disorder, recurrent, moderate: Secondary | ICD-10-CM | POA: Diagnosis not present

## 2021-11-28 DIAGNOSIS — F411 Generalized anxiety disorder: Secondary | ICD-10-CM | POA: Diagnosis not present

## 2021-12-03 DIAGNOSIS — L814 Other melanin hyperpigmentation: Secondary | ICD-10-CM | POA: Diagnosis not present

## 2021-12-03 DIAGNOSIS — L578 Other skin changes due to chronic exposure to nonionizing radiation: Secondary | ICD-10-CM | POA: Diagnosis not present

## 2021-12-03 DIAGNOSIS — L821 Other seborrheic keratosis: Secondary | ICD-10-CM | POA: Diagnosis not present

## 2021-12-03 DIAGNOSIS — D225 Melanocytic nevi of trunk: Secondary | ICD-10-CM | POA: Diagnosis not present

## 2021-12-05 DIAGNOSIS — H903 Sensorineural hearing loss, bilateral: Secondary | ICD-10-CM | POA: Diagnosis not present

## 2021-12-23 ENCOUNTER — Ambulatory Visit
Admission: RE | Admit: 2021-12-23 | Discharge: 2021-12-23 | Disposition: A | Discharge status: Home / Self Care | Payer: BC Managed Care – PPO | Source: Ambulatory Visit | Attending: Obstetrics and Gynecology | Admitting: Obstetrics and Gynecology

## 2021-12-23 DIAGNOSIS — J029 Acute pharyngitis, unspecified: Secondary | ICD-10-CM | POA: Diagnosis not present

## 2021-12-23 DIAGNOSIS — Z1231 Encounter for screening mammogram for malignant neoplasm of breast: Secondary | ICD-10-CM

## 2021-12-23 DIAGNOSIS — Z20822 Contact with and (suspected) exposure to covid-19: Secondary | ICD-10-CM | POA: Diagnosis not present

## 2022-01-01 DIAGNOSIS — R053 Chronic cough: Secondary | ICD-10-CM | POA: Diagnosis not present

## 2022-01-01 DIAGNOSIS — H7291 Unspecified perforation of tympanic membrane, right ear: Secondary | ICD-10-CM | POA: Diagnosis not present

## 2022-01-06 DIAGNOSIS — H9120 Sudden idiopathic hearing loss, unspecified ear: Secondary | ICD-10-CM | POA: Diagnosis not present

## 2022-01-06 DIAGNOSIS — H90A31 Mixed conductive and sensorineural hearing loss, unilateral, right ear with restricted hearing on the contralateral side: Secondary | ICD-10-CM | POA: Diagnosis not present

## 2022-01-23 DIAGNOSIS — F411 Generalized anxiety disorder: Secondary | ICD-10-CM | POA: Diagnosis not present

## 2022-01-23 DIAGNOSIS — F331 Major depressive disorder, recurrent, moderate: Secondary | ICD-10-CM | POA: Diagnosis not present

## 2022-01-30 DIAGNOSIS — F411 Generalized anxiety disorder: Secondary | ICD-10-CM | POA: Diagnosis not present

## 2022-01-30 DIAGNOSIS — F331 Major depressive disorder, recurrent, moderate: Secondary | ICD-10-CM | POA: Diagnosis not present

## 2022-02-12 DIAGNOSIS — H9313 Tinnitus, bilateral: Secondary | ICD-10-CM | POA: Diagnosis not present

## 2022-02-12 DIAGNOSIS — I110 Hypertensive heart disease with heart failure: Secondary | ICD-10-CM | POA: Diagnosis not present

## 2022-02-12 DIAGNOSIS — Z974 Presence of external hearing-aid: Secondary | ICD-10-CM | POA: Diagnosis not present

## 2022-02-12 DIAGNOSIS — I509 Heart failure, unspecified: Secondary | ICD-10-CM | POA: Diagnosis not present

## 2022-02-12 DIAGNOSIS — H903 Sensorineural hearing loss, bilateral: Secondary | ICD-10-CM | POA: Diagnosis not present

## 2022-02-12 DIAGNOSIS — F411 Generalized anxiety disorder: Secondary | ICD-10-CM | POA: Diagnosis not present

## 2022-02-12 DIAGNOSIS — H9123 Sudden idiopathic hearing loss, bilateral: Secondary | ICD-10-CM | POA: Diagnosis not present

## 2022-02-12 DIAGNOSIS — F331 Major depressive disorder, recurrent, moderate: Secondary | ICD-10-CM | POA: Diagnosis not present

## 2022-02-20 DIAGNOSIS — F331 Major depressive disorder, recurrent, moderate: Secondary | ICD-10-CM | POA: Diagnosis not present

## 2022-02-20 DIAGNOSIS — F411 Generalized anxiety disorder: Secondary | ICD-10-CM | POA: Diagnosis not present

## 2022-02-25 DIAGNOSIS — F331 Major depressive disorder, recurrent, moderate: Secondary | ICD-10-CM | POA: Diagnosis not present

## 2022-02-25 DIAGNOSIS — F411 Generalized anxiety disorder: Secondary | ICD-10-CM | POA: Diagnosis not present

## 2022-03-04 DIAGNOSIS — D709 Neutropenia, unspecified: Secondary | ICD-10-CM | POA: Diagnosis not present

## 2022-03-04 DIAGNOSIS — R5383 Other fatigue: Secondary | ICD-10-CM | POA: Diagnosis not present

## 2022-03-04 DIAGNOSIS — G2581 Restless legs syndrome: Secondary | ICD-10-CM | POA: Diagnosis not present

## 2022-03-05 DIAGNOSIS — L309 Dermatitis, unspecified: Secondary | ICD-10-CM | POA: Diagnosis not present

## 2022-03-07 IMAGING — MR MR HEAD WO/W CM
11 of 12 series · 36 of 48 positions shown · IV contrast (multihance)
Comparison: Head CT 07/06/2014

CLINICAL DATA: Left-sided sensorineural hearing loss and tinnitus.

EXAM:
MRI HEAD WITHOUT AND WITH CONTRAST
TECHNIQUE: Multiplanar, multiecho pulse sequences of the brain and surrounding
structures were obtained without and with intravenous contrast.
CONTRAST:  14mL MULTIHANCE GADOBENATE DIMEGLUMINE 529 MG/ML IV SOLN

[Series 2: T1 · sagittal · 5.0mm · 0.45mm/px · 3 of 21 slices shown (1 of 3)]
[im 1/21]
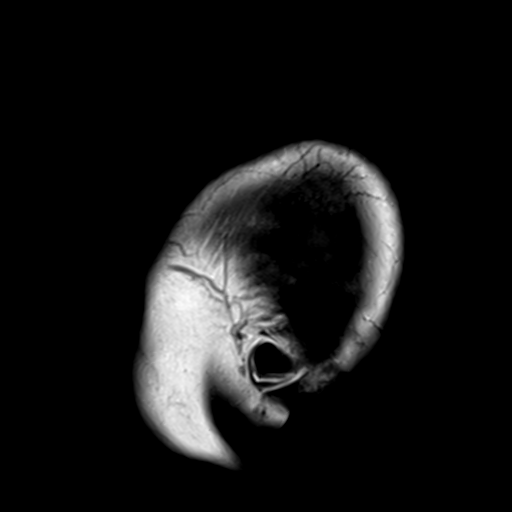
[im 11/21]
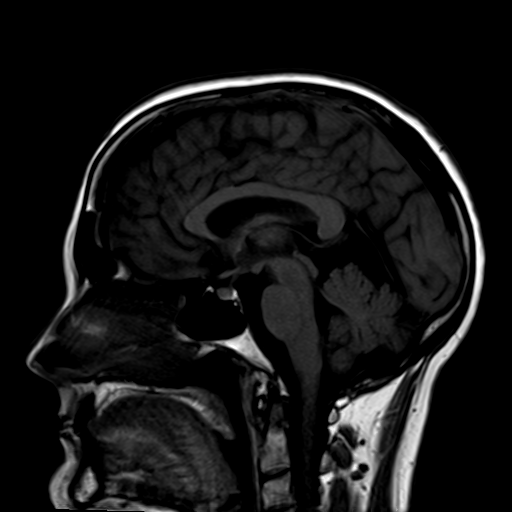
[im 21/21]
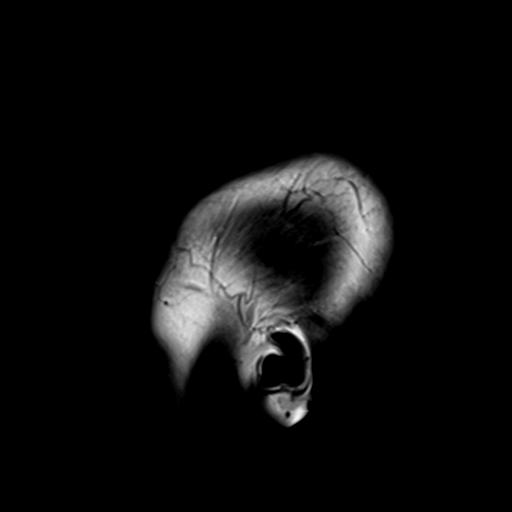

[Series 3: DWI · axial · 3.0mm · 1.80mm/px · z∈[-64,+83]mm · 9 of 100 slices shown (1 of 2)]
[im 1/100]
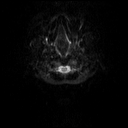
[im 19/100]
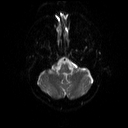
[im 28/100]
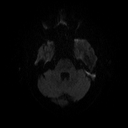
[im 46/100]
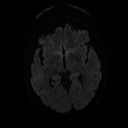
[im 55/100]
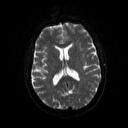
[im 73/100]
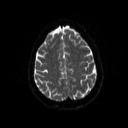
[im 82/100]
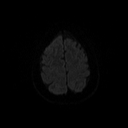
[im 91/100]
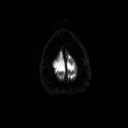
[im 100/100]
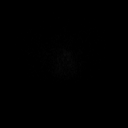

[Series 4: DWI · axial · 3.0mm · 1.80mm/px · z∈[-64,+83]mm · 6 of 50 slices shown (2 of 2)]
[im 1/50]
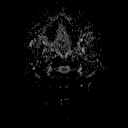
[im 10/50]
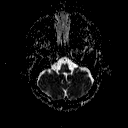
[im 20/50]
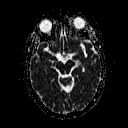
[im 30/50]
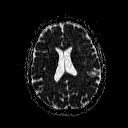
[im 40/50]
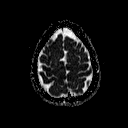
[im 50/50]
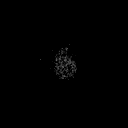

[Series 5: T2 · axial · 5.0mm · 0.45mm/px · z∈[-62,+81]mm · 3 of 23 slices shown]
[im 1/23]
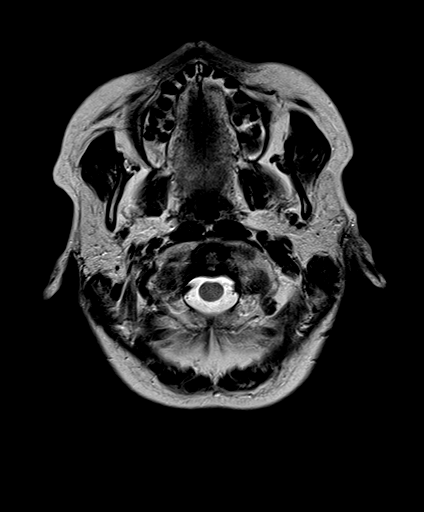
[im 12/23]
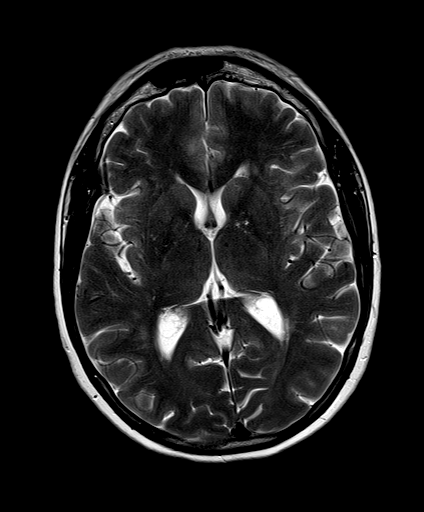
[im 23/23]
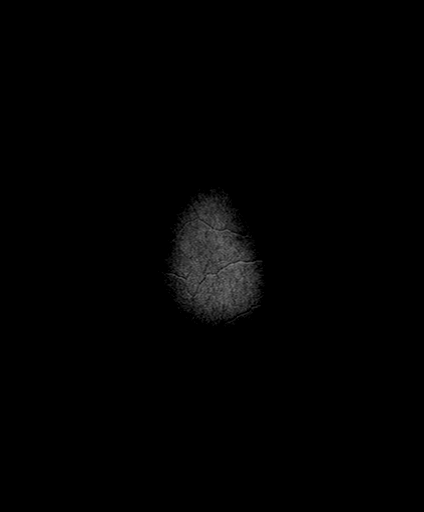

[Series 6: FLAIR · axial · 3.0mm · 0.45mm/px · z∈[-68,+88]mm · 3 of 27 slices shown]
[im 1/27]
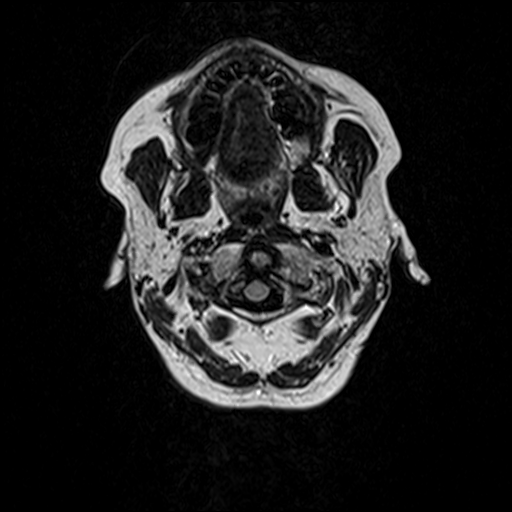
[im 14/27]
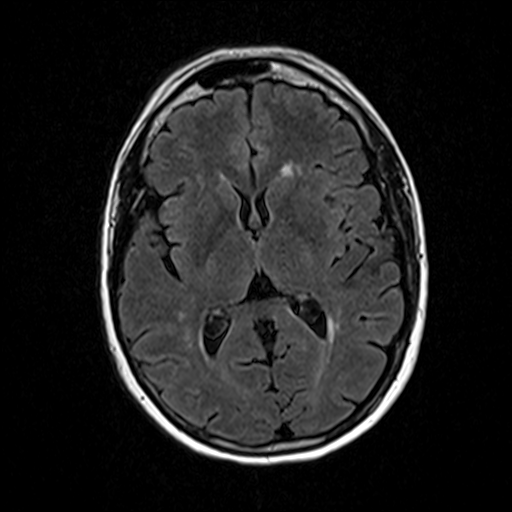
[im 27/27]
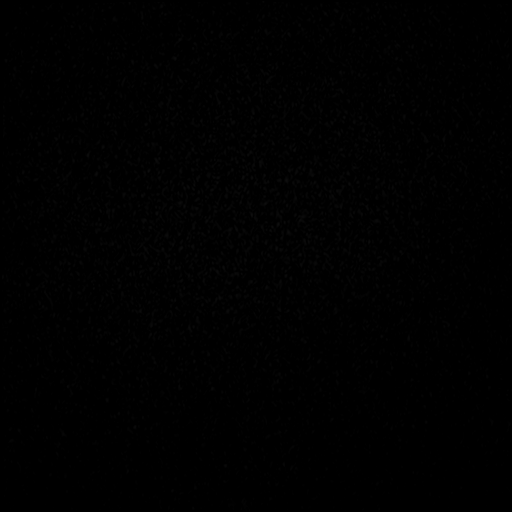

[Series 8: swi_images · axial · 3.0mm · 0.90mm/px · z∈[-61,+80]mm · 5 of 48 slices shown]
[im 1/48]
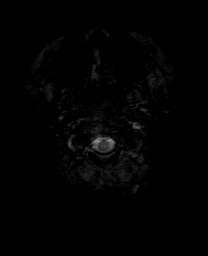
[im 12/48]
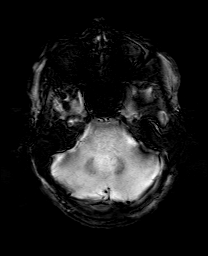
[im 24/48]
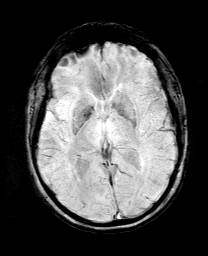
[im 36/48]
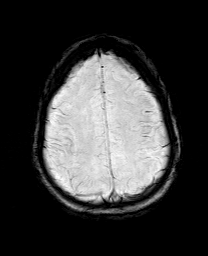
[im 48/48]
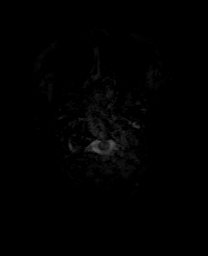

[Series 9: T1 · coronal · 3.0mm · 0.35mm/px · 1 of 11 slices shown (2 of 3)]
[im 1/11]
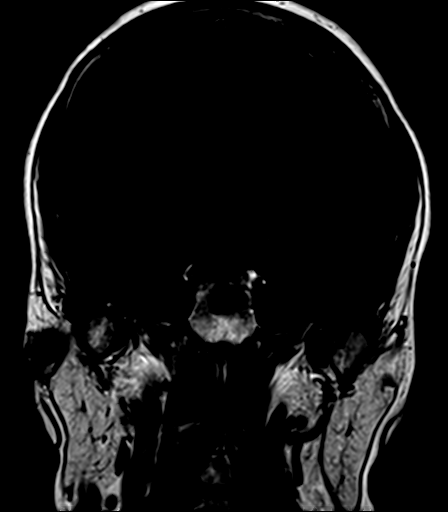

[Series 10: T1 · axial · 3.0mm · 0.35mm/px · 1 of 11 slices shown (3 of 3)]
[im 1/11]
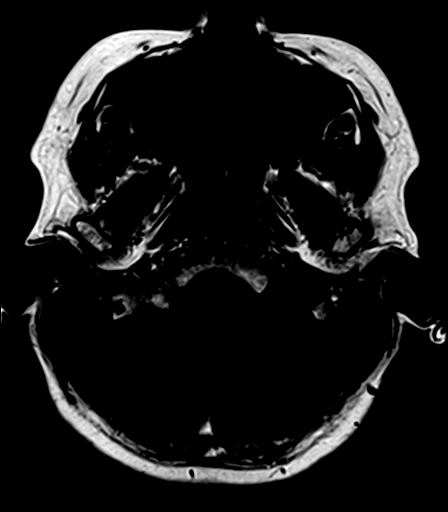

[Series 11: bSSFP · axial · 1.0mm · 0.28mm/px · z∈[-43,-20]mm · 3 of 36 slices shown]
[im 1/36]
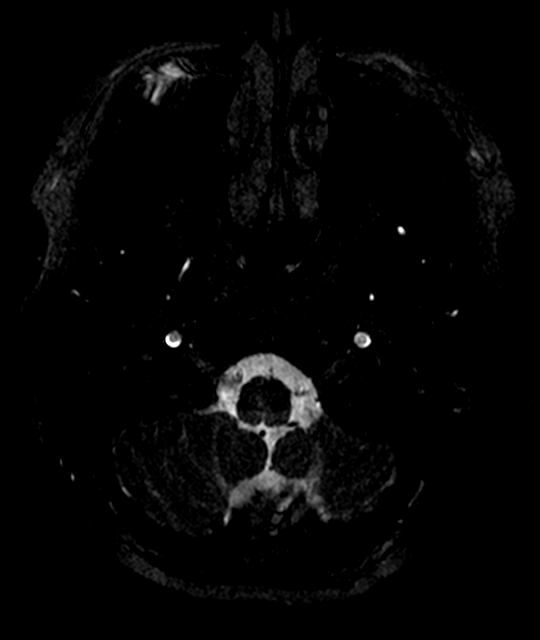
[im 12/36]
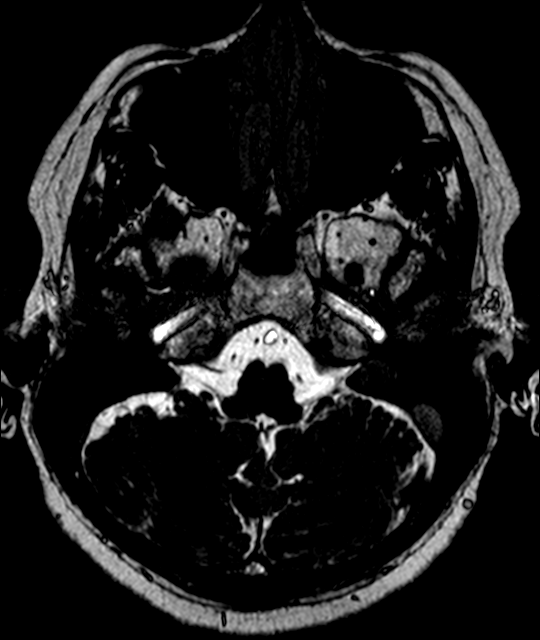
[im 24/36]
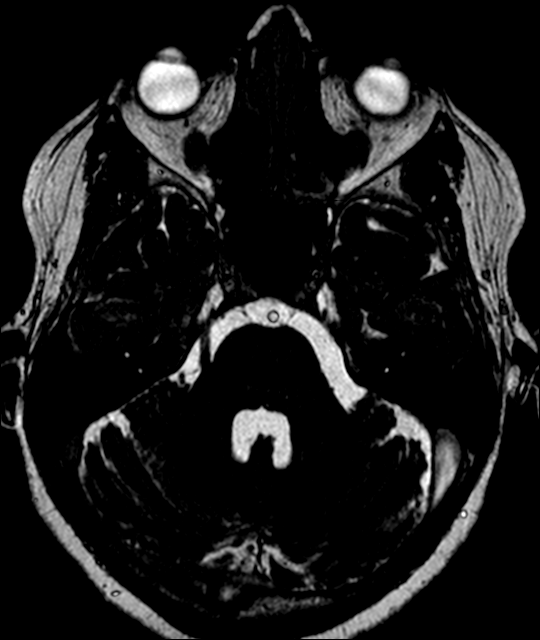

[Series 12: T1 post-contrast · axial · 3.0mm · 0.35mm/px · 1 of 11 slices shown (1 of 2)]
[im 1/11]
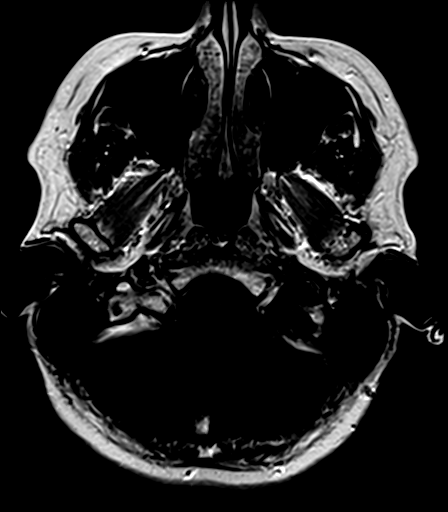

[Series 13: T1 post-contrast · coronal · 3.0mm · 0.35mm/px · 1 of 11 slices shown (2 of 2)]
[im 1/11]
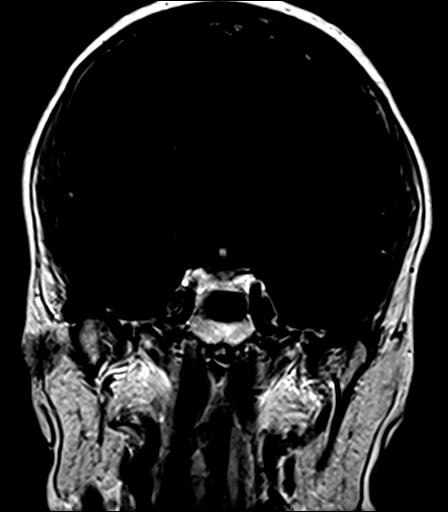

[36 of 48 positions shown; findings below may reference images not displayed]

FINDINGS: Brain: There is no evidence of an acute infarct, intracranial
hemorrhage, mass, midline shift, or extra-axial fluid collection.
The ventricles and sulci are normal. Small T2 hyperintensities in
the cerebral white matter bilaterally are nonspecific but compatible
with mildly age advanced chronic small vessel ischemic disease. No
abnormal enhancement is identified.

Dedicated imaging through the internal auditory canals demonstrates
a normal course of cranial nerves VII and VIII without evidence of a
mass or abnormal enhancement. The inner ear structures demonstrate
normal signal and morphology bilaterally. No mass is present in the
cerebellopontine angles.

Vascular: Major intracranial vascular flow voids are preserved.

Skull and upper cervical spine: Unremarkable bone marrow signal.

Sinuses/Orbits: Unremarkable orbits. Mild bilateral ethmoid sinus
mucosal thickening. Trace left mastoid fluid.

Other: None.
IMPRESSION: 1. Negative internal auditory canal imaging. No retrocochlear
lesion.
2. Mild chronic small vessel ischemic disease.

## 2022-03-26 DIAGNOSIS — F411 Generalized anxiety disorder: Secondary | ICD-10-CM | POA: Diagnosis not present

## 2022-03-26 DIAGNOSIS — F331 Major depressive disorder, recurrent, moderate: Secondary | ICD-10-CM | POA: Diagnosis not present

## 2022-04-15 DIAGNOSIS — F331 Major depressive disorder, recurrent, moderate: Secondary | ICD-10-CM | POA: Diagnosis not present

## 2022-04-15 DIAGNOSIS — F411 Generalized anxiety disorder: Secondary | ICD-10-CM | POA: Diagnosis not present

## 2022-04-23 DIAGNOSIS — T162XXA Foreign body in left ear, initial encounter: Secondary | ICD-10-CM | POA: Diagnosis not present

## 2022-04-24 ENCOUNTER — Ambulatory Visit: Payer: BC Managed Care – PPO | Admitting: Podiatry

## 2022-05-27 DIAGNOSIS — F331 Major depressive disorder, recurrent, moderate: Secondary | ICD-10-CM | POA: Diagnosis not present

## 2022-05-27 DIAGNOSIS — F411 Generalized anxiety disorder: Secondary | ICD-10-CM | POA: Diagnosis not present

## 2022-06-10 DIAGNOSIS — F331 Major depressive disorder, recurrent, moderate: Secondary | ICD-10-CM | POA: Diagnosis not present

## 2022-06-10 DIAGNOSIS — F411 Generalized anxiety disorder: Secondary | ICD-10-CM | POA: Diagnosis not present

## 2022-06-11 DIAGNOSIS — I10 Essential (primary) hypertension: Secondary | ICD-10-CM | POA: Diagnosis not present

## 2022-06-11 DIAGNOSIS — E785 Hyperlipidemia, unspecified: Secondary | ICD-10-CM | POA: Diagnosis not present

## 2022-06-11 DIAGNOSIS — S40012A Contusion of left shoulder, initial encounter: Secondary | ICD-10-CM | POA: Diagnosis not present

## 2022-06-11 DIAGNOSIS — Z0000 Encounter for general adult medical examination without abnormal findings: Secondary | ICD-10-CM | POA: Diagnosis not present

## 2022-06-11 DIAGNOSIS — Z23 Encounter for immunization: Secondary | ICD-10-CM | POA: Diagnosis not present

## 2022-06-14 DIAGNOSIS — M25512 Pain in left shoulder: Secondary | ICD-10-CM | POA: Diagnosis not present

## 2022-06-14 DIAGNOSIS — M25552 Pain in left hip: Secondary | ICD-10-CM | POA: Diagnosis not present

## 2022-06-18 DIAGNOSIS — E785 Hyperlipidemia, unspecified: Secondary | ICD-10-CM | POA: Diagnosis not present

## 2022-06-18 DIAGNOSIS — I10 Essential (primary) hypertension: Secondary | ICD-10-CM | POA: Diagnosis not present

## 2022-06-24 DIAGNOSIS — F411 Generalized anxiety disorder: Secondary | ICD-10-CM | POA: Diagnosis not present

## 2022-06-24 DIAGNOSIS — F331 Major depressive disorder, recurrent, moderate: Secondary | ICD-10-CM | POA: Diagnosis not present

## 2022-07-02 DIAGNOSIS — D72819 Decreased white blood cell count, unspecified: Secondary | ICD-10-CM | POA: Diagnosis not present

## 2022-07-08 DIAGNOSIS — F411 Generalized anxiety disorder: Secondary | ICD-10-CM | POA: Diagnosis not present

## 2022-07-16 DIAGNOSIS — M533 Sacrococcygeal disorders, not elsewhere classified: Secondary | ICD-10-CM | POA: Diagnosis not present

## 2022-07-17 ENCOUNTER — Telehealth: Payer: Self-pay | Admitting: Hematology and Oncology

## 2022-07-17 NOTE — Telephone Encounter (Signed)
scheduled per referral, pt has been called and confirmed date and time. Pt is aware of location and to arrive early for check in   

## 2022-07-22 DIAGNOSIS — F411 Generalized anxiety disorder: Secondary | ICD-10-CM | POA: Diagnosis not present

## 2022-07-23 DIAGNOSIS — M461 Sacroiliitis, not elsewhere classified: Secondary | ICD-10-CM | POA: Diagnosis not present

## 2022-08-07 ENCOUNTER — Inpatient Hospital Stay: Payer: BC Managed Care – PPO | Attending: Hematology and Oncology | Admitting: Hematology and Oncology

## 2022-08-07 ENCOUNTER — Inpatient Hospital Stay: Payer: BC Managed Care – PPO

## 2022-08-07 VITALS — BP 156/94 | HR 74 | Temp 97.5°F | Resp 16 | Wt 151.3 lb

## 2022-08-07 DIAGNOSIS — Z803 Family history of malignant neoplasm of breast: Secondary | ICD-10-CM | POA: Insufficient documentation

## 2022-08-07 DIAGNOSIS — D72819 Decreased white blood cell count, unspecified: Secondary | ICD-10-CM

## 2022-08-07 DIAGNOSIS — D709 Neutropenia, unspecified: Secondary | ICD-10-CM | POA: Diagnosis not present

## 2022-08-07 DIAGNOSIS — Z79899 Other long term (current) drug therapy: Secondary | ICD-10-CM | POA: Insufficient documentation

## 2022-08-07 DIAGNOSIS — Z79620 Long term (current) use of immunosuppressive biologic: Secondary | ICD-10-CM | POA: Insufficient documentation

## 2022-08-07 DIAGNOSIS — F411 Generalized anxiety disorder: Secondary | ICD-10-CM | POA: Diagnosis not present

## 2022-08-07 LAB — CBC WITH DIFFERENTIAL/PLATELET
Abs Immature Granulocytes: 0.01 10*3/uL (ref 0.00–0.07)
Basophils Absolute: 0.1 10*3/uL (ref 0.0–0.1)
Basophils Relative: 1 %
Eosinophils Absolute: 0.1 10*3/uL (ref 0.0–0.5)
Eosinophils Relative: 3 %
HCT: 40.4 % (ref 36.0–46.0)
Hemoglobin: 14.1 g/dL (ref 12.0–15.0)
Immature Granulocytes: 0 %
Lymphocytes Relative: 39 %
Lymphs Abs: 1.4 10*3/uL (ref 0.7–4.0)
MCH: 31.6 pg (ref 26.0–34.0)
MCHC: 34.9 g/dL (ref 30.0–36.0)
MCV: 90.6 fL (ref 80.0–100.0)
Monocytes Absolute: 0.5 10*3/uL (ref 0.1–1.0)
Monocytes Relative: 15 %
Neutro Abs: 1.5 10*3/uL — ABNORMAL LOW (ref 1.7–7.7)
Neutrophils Relative %: 42 %
Platelets: 246 10*3/uL (ref 150–400)
RBC: 4.46 MIL/uL (ref 3.87–5.11)
RDW: 12 % (ref 11.5–15.5)
WBC: 3.7 10*3/uL — ABNORMAL LOW (ref 4.0–10.5)
nRBC: 0 % (ref 0.0–0.2)

## 2022-08-07 LAB — COMPREHENSIVE METABOLIC PANEL
ALT: 24 U/L (ref 0–44)
AST: 22 U/L (ref 15–41)
Albumin: 5 g/dL (ref 3.5–5.0)
Alkaline Phosphatase: 58 U/L (ref 38–126)
Anion gap: 8 (ref 5–15)
BUN: 16 mg/dL (ref 6–20)
CO2: 33 mmol/L — ABNORMAL HIGH (ref 22–32)
Calcium: 10 mg/dL (ref 8.9–10.3)
Chloride: 96 mmol/L — ABNORMAL LOW (ref 98–111)
Creatinine, Ser: 0.76 mg/dL (ref 0.44–1.00)
GFR, Estimated: >60 mL/min (ref >60)
Glucose, Bld: 106 mg/dL — ABNORMAL HIGH (ref 70–99)
Potassium: 3.6 mmol/L (ref 3.5–5.1)
Sodium: 137 mmol/L (ref 135–145)
Total Bilirubin: 0.7 mg/dL (ref 0.3–1.2)
Total Protein: 7.5 g/dL (ref 6.5–8.1)

## 2022-08-07 LAB — VITAMIN B12: Vitamin B-12: 440 pg/mL (ref 180–914)

## 2022-08-07 LAB — FERRITIN: Ferritin: 62 ng/mL (ref 11–307)

## 2022-08-07 LAB — TSH: TSH: 1.52 u[IU]/mL (ref 0.350–4.500)

## 2022-08-07 LAB — IRON AND IRON BINDING CAPACITY (CC-WL,HP ONLY)
Iron: 103 ug/dL (ref 28–170)
Saturation Ratios: 30 % (ref 10.4–31.8)
TIBC: 340 ug/dL (ref 250–450)
UIBC: 237 ug/dL (ref 148–442)

## 2022-08-07 NOTE — Progress Notes (Signed)
[name] CONSULT NOTE  Patient Care Team: Ileana Ladd, MD (Inactive) as PCP - General (Family Medicine)  CHIEF COMPLAINTS/PURPOSE OF CONSULTATION:  Neutropenia.  ASSESSMENT & PLAN:   This is a very pleasant 62 year old female patient with no significant past medical history except for depression, fibromyalgia, sudden onset bilateral hearing loss now referred to hematology for further investigation of neutropenia and leukopenia.  Differential diagnosis: 1. Medications 2. viral illnesses 3. Autoimmune conditions like lupus 4. B-12 or folic acid deficiencies 5. Bone marrow disorders 6. Cyclical neutropenia 7. Ethnicity related    Recommendation: 1. Recheck CBC with differential 2. B-12 and folic acid levels 3. ANA 4. Flow cytometry 5.  Smear review 6.  TSH  If she has persistent leukopenia and all above tests are normal then we may have to perform a bone marrow biopsy. If she only has transient leukopenia then we could watch and monitor this. I will call the patient with the results of these tests to discuss the further plan. We have also briefly discussed if there is any benefit from rheumatology evaluation and if there is any concern for autoimmune etiology causing bilateral hearing loss as well as leukopenia.  I will send a message to her rheumatologist to see if she will benefit from a rheumatology referral especially with her joint pains as well.  She expressed understanding of all the recommendations.  Thank you for consulting [place] in the care of this patient.  Please not hesitate to contact us with any additional questions or concerns.  HISTORY OF PRESENTING ILLNESS:  Heather [name] 62 y.o. female is here because of Neutropenia.  This is a very pleasant 62 year old female patient with past medical history significant for fibromyalgia, depression who had sudden onset hearing loss last year, bilateral evaluated by ENT locally as well as at [facility], had  extensive investigation according to the patient with no clear etiology for the bilateral hearing loss.  During this workup she was also found to have mild leukopenia which was followed. On most recent labs she had persistent leukopenia and neutropenia and she is referred to hematology for further recommendations.  She denies any fevers, drenching night sweats, loss of appetite or unintentional loss of weight.  No known infections however she does remember getting her COVID vaccinations.  No known autoimmune diseases.  No known nutritional deficiency, hypothyroidism or hyperthyroidism.  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  Rest of the pertinent 10 point ROS reviewed and negative  REVIEW OF SYSTEMS:   Constitutional: Denies fevers, chills or abnormal night sweats Eyes: Denies blurriness of vision, double vision or watery eyes Ears, nose, mouth, throat, and face: Denies mucositis or sore throat Respiratory: Denies cough, dyspnea or wheezes Cardiovascular: Denies palpitation, chest discomfort or lower extremity swelling Gastrointestinal:  Denies nausea, heartburn or change in bowel habits Skin: Denies abnormal skin rashes Lymphatics: Denies new lymphadenopathy or easy bruising Neurological:Denies numbness, tingling or new weaknesses Behavioral/Psych: Mood is stable, no new changes  All other systems were reviewed with the patient and are negative.  MEDICAL HISTORY:  Past Medical History:  Diagnosis Date   Acid reflux    Anxiety    ASCUS with positive high risk HPV 2006   Depression    Fibromyalgia    Hypercholesteremia 1999   Hypertension 2005   Restless leg syndrome 2003   Urinary incontinence    tx'd with [employer]    SURGICAL HISTORY: Past Surgical  History:  Procedure Laterality Date   BLADDER SUSPENSION  2010   BUNIONECTOMY Left 04/15/2016   DIAGNOSTIC LAPAROSCOPY  1994    Normal   TUBAL LIGATION  2004     SOCIAL HISTORY: Social History   Socioeconomic History   Marital status: Married    Spouse name: Not on file   Number of children: Not on file   Years of education: Not on file   Highest education level: Not on file  Occupational History   Not on file  Tobacco Use   Smoking status: Never   Smokeless tobacco: Never  Vaping Use   Vaping Use: Never used  Substance and Sexual Activity   Alcohol use: Yes    Alcohol/week: 7.0 standard drinks of alcohol    Types: 7 Standard drinks or equivalent per week   Drug use: No   Sexual activity: Yes    Partners: Male    Birth control/protection: Post-menopausal  Other Topics Concern   Not on file  Social History Narrative   Not on file   Social Determinants of Health   Financial Resource Strain: Not on file  Food Insecurity: Not on file  Transportation Needs: Not on file  Physical Activity: Not on file  Stress: Not on file  Social Connections: Not on file  Intimate Partner Violence: Not on file    FAMILY HISTORY: Family History  Problem Relation Age of Onset   Hypertension Mother    Depression Mother    Osteoporosis Mother 21   Hypertension Father    Depression Father    Hypertension Maternal Grandmother    Hypertension Maternal Grandfather    Depression Paternal Grandmother    Breast cancer Maternal Aunt 48       died in her 4's    ALLERGIES:  is allergic to penicillins, amlodipine, erythromycin, lisinopril, codeine, and sulfa antibiotics.  MEDICATIONS:  Current Outpatient Medications  Medication Sig Dispense Refill   ALPRAZolam (XANAX) 0.5 MG tablet 1 tablet     B Complex Vitamins (B COMPLEX PO) Take by mouth.     buPROPion (WELLBUTRIN XL) 300 MG 24 hr tablet Take 300 mg by mouth daily.     Calcium-Magnesium-Vitamin D (CALCIUM MAGNESIUM PO) Take by mouth. Patient takes 1200 mg     carvedilol (COREG) 25 MG tablet TK 1 T PO BID  2   escitalopram (LEXAPRO) 5 MG tablet Take 5 mg by mouth daily.     FOLIC ACID PO  Take by mouth.     gabapentin (NEURONTIN) 400 MG capsule TK 1 C PO QHS  1   Omega-3 Fatty Acids (OMEGA 3 PO) Take by mouth.     simvastatin (ZOCOR) 10 MG tablet Take 10 mg by mouth daily.     terazosin (HYTRIN) 2 MG capsule Take 1 capsule by mouth daily.     triamterene-hydrochlorothiazide (DYAZIDE) 37.5-25 MG capsule Take 1 capsule by mouth every morning.     valACYclovir (VALTREX) 1000 MG tablet TAKE 2 TABLETS BY MOUTH TWICE DAILY FOR 1 DAY 30 tablet 1   No current facility-administered medications for this visit.    PHYSICAL EXAMINATION: ECOG PERFORMANCE STATUS: 0 - Asymptomatic  Vitals:   08/07/22 0958  BP: (!) 156/94  Pulse: 74  Resp: 16  Temp: (!) 97.5 F (36.4 C)  SpO2: 97%   Filed Weights   08/07/22 0958  Weight: 151 lb 4.8 oz (68.6 kg)    GENERAL:alert, no distress and comfortable SKIN: skin color, texture, turgor are normal, no rashes  or significant lesions EYES: normal, conjunctiva are pink and non-injected, sclera clear OROPHARYNX:no exudate, no erythema and lips, buccal mucosa, and tongue normal  NECK: supple, thyroid normal size, non-tender, without nodularity LYMPH:  no palpable lymphadenopathy in the cervical, axillary LUNGS: clear to auscultation and percussion with normal breathing effort HEART: regular rate & rhythm and no murmurs and no lower extremity edema ABDOMEN:abdomen soft, non-tender and normal bowel sounds Musculoskeletal:no cyanosis of digits and no clubbing  PSYCH: alert & oriented x 3 with fluent speech NEURO: no focal motor/sensory deficits  LABORATORY DATA:  I have reviewed the data as listed Lab Results  Component Value Date   WBC 6.1 10/18/2007   HGB 13.3 10/18/2007   HCT 37.7 10/18/2007   MCV 91.4 10/18/2007   PLT 247 10/18/2007     Chemistry      Component Value Date/Time   NA 137 10/18/2007 0918   K 4.8 10/18/2007 0918   CL 100 10/18/2007 0918   CO2 28 10/18/2007 0918   BUN 15 10/18/2007 0918   CREATININE 0.67 10/18/2007  0918      Component Value Date/Time   CALCIUM 9.7 10/18/2007 0918   ALKPHOS 41 10/18/2007 0918   AST 23 10/18/2007 0918   ALT 28 10/18/2007 0918   BILITOT 0.8 10/18/2007 0918       RADIOGRAPHIC STUDIES: I have personally reviewed the radiological images as listed and agreed with the findings in the report. No results found.  All questions were answered. The patient knows to call the clinic with any problems, questions or concerns. I spent 45 minutes in the care of this patient including H and P, review of records, counseling and coordination of care.     Rachel Moulds, MD 08/07/2022 10:04 AM

## 2022-08-08 LAB — SURGICAL PATHOLOGY

## 2022-08-08 LAB — FOLATE RBC
Folate, Hemolysate: >620 ng/mL
Folate, RBC: >1449 ng/mL (ref >498)
Hematocrit: 42.8 % (ref 34.0–46.6)

## 2022-08-09 LAB — ANTINUCLEAR ANTIBODIES, IFA: ANA Ab, IFA: NEGATIVE

## 2022-08-11 LAB — FLOW CYTOMETRY

## 2022-08-13 DIAGNOSIS — M25512 Pain in left shoulder: Secondary | ICD-10-CM | POA: Diagnosis not present

## 2022-08-13 DIAGNOSIS — M25552 Pain in left hip: Secondary | ICD-10-CM | POA: Diagnosis not present

## 2022-08-18 ENCOUNTER — Encounter: Payer: Self-pay | Admitting: Hematology and Oncology

## 2022-08-18 ENCOUNTER — Inpatient Hospital Stay (HOSPITAL_BASED_OUTPATIENT_CLINIC_OR_DEPARTMENT_OTHER): Payer: BC Managed Care – PPO | Admitting: Hematology and Oncology

## 2022-08-18 DIAGNOSIS — D72819 Decreased white blood cell count, unspecified: Secondary | ICD-10-CM

## 2022-08-18 DIAGNOSIS — D709 Neutropenia, unspecified: Secondary | ICD-10-CM | POA: Diagnosis not present

## 2022-08-18 NOTE — Progress Notes (Signed)
[facility] CONSULT NOTE  Patient Care Team: Ileana Ladd, MD (Inactive) as PCP - General (Family Medicine)  CHIEF COMPLAINTS/PURPOSE OF CONSULTATION:  Neutropenia.  ASSESSMENT & PLAN:   This is a very pleasant 62 year old female patient with no significant past medical history except for depression, fibromyalgia, sudden onset bilateral hearing loss now referred to hematology for further investigation of neutropenia and leukopenia. She is here for a telephone visit to review lab results. CBC showed mild leukopenia, mild neutropenia. No evidence of nutritional deficiency. No autoimmune etiology, hypothyroidism. Flow cytometry normal. We discussed about continuing surveillance, return to clinic in 4 months.  HISTORY OF PRESENTING ILLNESS:  Heather [name] 62 y.o. female is here because of Neutropenia.  This is a very pleasant 62 year old female patient with past medical history significant for fibromyalgia, depression who had sudden onset hearing loss last year, bilateral evaluated by ENT locally as well as at [facility], had extensive investigation according to the patient with no clear etiology for the bilateral hearing loss.  During this workup she was also found to have mild leukopenia which was followed. On most recent labs she had persistent leukopenia and neutropenia and she is referred to hematology for further recommendations.   She is here for a telephone visit to review lab results. She denies any new complaints at all today.  REVIEW OF SYSTEMS:   Constitutional: Denies fevers, chills or abnormal night sweats Eyes: Denies blurriness of vision, double vision or watery eyes Ears, nose, mouth, throat, and face: Denies mucositis or sore throat Respiratory: Denies cough, dyspnea or wheezes Cardiovascular: Denies palpitation, chest discomfort or lower extremity swelling Gastrointestinal:  Denies nausea, heartburn or change in bowel habits Skin: Denies abnormal skin  rashes Lymphatics: Denies new lymphadenopathy or easy bruising Neurological:Denies numbness, tingling or new weaknesses Behavioral/Psych: Mood is stable, no new changes  All other systems were reviewed with the patient and are negative.  MEDICAL HISTORY:  Past Medical History:  Diagnosis Date   Acid reflux    Anxiety    ASCUS with positive high risk HPV 2006   Depression    Fibromyalgia    Hypercholesteremia 1999   Hypertension 2005   Restless leg syndrome 2003   Urinary incontinence    tx'd with [facility]    SURGICAL HISTORY: Past Surgical History:  Procedure Laterality Date   BLADDER SUSPENSION  2010   BUNIONECTOMY Left 04/15/2016   DIAGNOSTIC LAPAROSCOPY  1994    Normal   TUBAL LIGATION  2004    SOCIAL HISTORY: Social History   Socioeconomic History   Marital status: Married    Spouse name: Not on file   Number of children: Not on file   Years of education: Not on file   Highest education level: Not on file  Occupational History   Not on file  Tobacco Use   Smoking status: Never   Smokeless tobacco: Never  Vaping Use   Vaping Use: Never used  Substance and Sexual Activity   Alcohol use: Yes    Alcohol/week: 7.0 standard drinks of alcohol    Types: 7 Standard drinks or equivalent per week   Drug use: No   Sexual activity: Yes    Partners: Male    Birth control/protection: Post-menopausal  Other Topics Concern   Not on file  Social History Narrative   Not on file   Social Determinants of Health   Financial Resource Strain: Not on file  Food Insecurity: Not on file  Transportation Needs: Not on  file  Physical Activity: Not on file  Stress: Not on file  Social Connections: Not on file  Intimate Partner Violence: Not on file    FAMILY HISTORY: Family History  Problem Relation Age of Onset   Hypertension Mother    Depression Mother    Osteoporosis Mother 85   Hypertension Father    Depression Father    Hypertension Maternal Grandmother     Hypertension Maternal Grandfather    Depression Paternal Grandmother    Breast cancer Maternal Aunt 48       died in her 31's    ALLERGIES:  is allergic to penicillins, amlodipine, erythromycin, lisinopril, codeine, and sulfa antibiotics.  MEDICATIONS:  Current Outpatient Medications  Medication Sig Dispense Refill   ALPRAZolam (XANAX) 0.5 MG tablet 1 tablet     B Complex Vitamins (B COMPLEX PO) Take by mouth.     buPROPion (WELLBUTRIN XL) 300 MG 24 hr tablet Take 300 mg by mouth daily.     Calcium-Magnesium-Vitamin D (CALCIUM MAGNESIUM PO) Take by mouth. Patient takes 1200 mg     carvedilol (COREG) 25 MG tablet TK 1 T PO BID  2   escitalopram (LEXAPRO) 5 MG tablet Take 5 mg by mouth daily.     FOLIC ACID PO Take by mouth.     gabapentin (NEURONTIN) 400 MG capsule TK 1 C PO QHS  1   Omega-3 Fatty Acids (OMEGA 3 PO) Take by mouth.     simvastatin (ZOCOR) 10 MG tablet Take 10 mg by mouth daily.     terazosin (HYTRIN) 2 MG capsule Take 1 capsule by mouth daily.     triamterene-hydrochlorothiazide (DYAZIDE) 37.5-25 MG capsule Take 1 capsule by mouth every morning.     valACYclovir (VALTREX) 1000 MG tablet TAKE 2 TABLETS BY MOUTH TWICE DAILY FOR 1 DAY 30 tablet 1   No current facility-administered medications for this visit.    PHYSICAL EXAMINATION: ECOG PERFORMANCE STATUS: 0 - Asymptomatic  VS and PE not done, telephone visit.  LABORATORY DATA:  I have reviewed the data as listed Lab Results  Component Value Date   WBC 3.7 (L) 08/07/2022   HGB 14.1 08/07/2022   HCT 42.8 08/07/2022   HCT 40.4 08/07/2022   MCV 90.6 08/07/2022   PLT 246 08/07/2022     Chemistry      Component Value Date/Time   NA 137 08/07/2022 1044   K 3.6 08/07/2022 1044   CL 96 (L) 08/07/2022 1044   CO2 33 (H) 08/07/2022 1044   BUN 16 08/07/2022 1044   CREATININE 0.76 08/07/2022 1044      Component Value Date/Time   CALCIUM 10.0 08/07/2022 1044   ALKPHOS 58 08/07/2022 1044   AST 22 08/07/2022 1044    ALT 24 08/07/2022 1044   BILITOT 0.7 08/07/2022 1044     CBC showed mild leukopenia with white count of 3700, mild neutropenia at 1500 Iron binding panel with no evidence of iron deficiency B12 levels are normal, RBC folate normal ANA negative TSH normal Flow cytometry negative  RADIOGRAPHIC STUDIES: I have personally reviewed the radiological images as listed and agreed with the findings in the report. No results found.  All questions were answered. The patient knows to call the clinic with any problems, questions or concerns.  I connected with  DEZLYNN KUHNKE on 08/18/22 by a telephone application and verified that I am speaking with the correct person using two identifiers.   I discussed the limitations of evaluation and management  by telemedicine. The patient expressed understanding and agreed to proceed.  Total time spent: 10 min    Rachel Moulds, MD 08/18/2022 9:11 AM

## 2022-08-19 DIAGNOSIS — F411 Generalized anxiety disorder: Secondary | ICD-10-CM | POA: Diagnosis not present

## 2022-08-20 ENCOUNTER — Encounter: Payer: Self-pay | Admitting: Hematology and Oncology

## 2022-09-02 DIAGNOSIS — F411 Generalized anxiety disorder: Secondary | ICD-10-CM | POA: Diagnosis not present

## 2022-09-16 DIAGNOSIS — F411 Generalized anxiety disorder: Secondary | ICD-10-CM | POA: Diagnosis not present

## 2022-10-14 DIAGNOSIS — F411 Generalized anxiety disorder: Secondary | ICD-10-CM | POA: Diagnosis not present

## 2022-10-28 DIAGNOSIS — F411 Generalized anxiety disorder: Secondary | ICD-10-CM | POA: Diagnosis not present

## 2022-11-06 DIAGNOSIS — U071 COVID-19: Secondary | ICD-10-CM | POA: Diagnosis not present

## 2022-11-08 ENCOUNTER — Inpatient Hospital Stay (HOSPITAL_BASED_OUTPATIENT_CLINIC_OR_DEPARTMENT_OTHER)
Admission: EM | Admit: 2022-11-08 | Discharge: 2022-11-10 | DRG: 641 | Disposition: A | Discharge status: Home / Self Care | Payer: BC Managed Care – PPO | Attending: Internal Medicine | Admitting: Internal Medicine

## 2022-11-08 ENCOUNTER — Other Ambulatory Visit: Payer: Self-pay

## 2022-11-08 ENCOUNTER — Encounter (HOSPITAL_BASED_OUTPATIENT_CLINIC_OR_DEPARTMENT_OTHER): Payer: Self-pay

## 2022-11-08 DIAGNOSIS — R112 Nausea with vomiting, unspecified: Secondary | ICD-10-CM

## 2022-11-08 DIAGNOSIS — E7800 Pure hypercholesterolemia, unspecified: Secondary | ICD-10-CM | POA: Diagnosis not present

## 2022-11-08 DIAGNOSIS — Z888 Allergy status to other drugs, medicaments and biological substances status: Secondary | ICD-10-CM

## 2022-11-08 DIAGNOSIS — K219 Gastro-esophageal reflux disease without esophagitis: Secondary | ICD-10-CM | POA: Diagnosis present

## 2022-11-08 DIAGNOSIS — E876 Hypokalemia: Secondary | ICD-10-CM | POA: Insufficient documentation

## 2022-11-08 DIAGNOSIS — F419 Anxiety disorder, unspecified: Secondary | ICD-10-CM | POA: Diagnosis present

## 2022-11-08 DIAGNOSIS — Z880 Allergy status to penicillin: Secondary | ICD-10-CM

## 2022-11-08 DIAGNOSIS — Z79899 Other long term (current) drug therapy: Secondary | ICD-10-CM | POA: Diagnosis not present

## 2022-11-08 DIAGNOSIS — Z881 Allergy status to other antibiotic agents status: Secondary | ICD-10-CM

## 2022-11-08 DIAGNOSIS — E871 Hypo-osmolality and hyponatremia: Principal | ICD-10-CM | POA: Diagnosis present

## 2022-11-08 DIAGNOSIS — I10 Essential (primary) hypertension: Secondary | ICD-10-CM | POA: Diagnosis present

## 2022-11-08 DIAGNOSIS — Z885 Allergy status to narcotic agent status: Secondary | ICD-10-CM | POA: Diagnosis not present

## 2022-11-08 DIAGNOSIS — M797 Fibromyalgia: Secondary | ICD-10-CM | POA: Diagnosis not present

## 2022-11-08 DIAGNOSIS — H903 Sensorineural hearing loss, bilateral: Secondary | ICD-10-CM | POA: Diagnosis not present

## 2022-11-08 DIAGNOSIS — Z882 Allergy status to sulfonamides status: Secondary | ICD-10-CM | POA: Diagnosis not present

## 2022-11-08 DIAGNOSIS — Z8616 Personal history of COVID-19: Secondary | ICD-10-CM

## 2022-11-08 DIAGNOSIS — F32A Depression, unspecified: Secondary | ICD-10-CM | POA: Diagnosis not present

## 2022-11-08 DIAGNOSIS — U071 COVID-19: Secondary | ICD-10-CM | POA: Diagnosis not present

## 2022-11-08 DIAGNOSIS — G479 Sleep disorder, unspecified: Secondary | ICD-10-CM | POA: Diagnosis present

## 2022-11-08 DIAGNOSIS — E861 Hypovolemia: Secondary | ICD-10-CM | POA: Diagnosis present

## 2022-11-08 DIAGNOSIS — G2581 Restless legs syndrome: Secondary | ICD-10-CM | POA: Diagnosis present

## 2022-11-08 DIAGNOSIS — D709 Neutropenia, unspecified: Secondary | ICD-10-CM | POA: Diagnosis not present

## 2022-11-08 LAB — BASIC METABOLIC PANEL
Anion gap: 11 (ref 5–15)
BUN: 7 mg/dL (ref 6–20)
CO2: 26 mmol/L (ref 22–32)
Calcium: 9.2 mg/dL (ref 8.9–10.3)
Chloride: 94 mmol/L — ABNORMAL LOW (ref 98–111)
Creatinine, Ser: 0.71 mg/dL (ref 0.44–1.00)
GFR, Estimated: >60 mL/min (ref >60)
Glucose, Bld: 113 mg/dL — ABNORMAL HIGH (ref 70–99)
Potassium: 2.9 mmol/L — ABNORMAL LOW (ref 3.5–5.1)
Sodium: 131 mmol/L — ABNORMAL LOW (ref 135–145)

## 2022-11-08 LAB — CBC WITH DIFFERENTIAL/PLATELET
Abs Immature Granulocytes: 0.01 10*3/uL (ref 0.00–0.07)
Basophils Absolute: 0 10*3/uL (ref 0.0–0.1)
Basophils Relative: 1 %
Eosinophils Absolute: 0 10*3/uL (ref 0.0–0.5)
Eosinophils Relative: 1 %
Hemoglobin: 14.9 g/dL (ref 12.0–15.0)
Immature Granulocytes: 1 %
Lymphocytes Relative: 52 %
Lymphs Abs: 1.1 10*3/uL (ref 0.7–4.0)
Monocytes Absolute: 0.4 10*3/uL (ref 0.1–1.0)
Monocytes Relative: 19 %
Neutro Abs: 0.6 10*3/uL — ABNORMAL LOW (ref 1.7–7.7)
Neutrophils Relative %: 26 %
Platelets: 214 10*3/uL (ref 150–400)
WBC: 2.2 10*3/uL — ABNORMAL LOW (ref 4.0–10.5)
nRBC: 0 % (ref 0.0–0.2)

## 2022-11-08 LAB — COMPREHENSIVE METABOLIC PANEL
ALT: 62 U/L — ABNORMAL HIGH (ref 0–44)
AST: 64 U/L — ABNORMAL HIGH (ref 15–41)
Albumin: 4.9 g/dL (ref 3.5–5.0)
Alkaline Phosphatase: 50 U/L (ref 38–126)
Anion gap: 10 (ref 5–15)
BUN: 6 mg/dL (ref 6–20)
CO2: 26 mmol/L (ref 22–32)
Calcium: 9.5 mg/dL (ref 8.9–10.3)
Chloride: 80 mmol/L — ABNORMAL LOW (ref 98–111)
Creatinine, Ser: 0.69 mg/dL (ref 0.44–1.00)
GFR, Estimated: >60 mL/min (ref >60)
Glucose, Bld: 103 mg/dL — ABNORMAL HIGH (ref 70–99)
Potassium: 3.8 mmol/L (ref 3.5–5.1)
Sodium: 116 mmol/L — CL (ref 135–145)
Total Bilirubin: 0.5 mg/dL (ref 0.3–1.2)
Total Protein: 7.5 g/dL (ref 6.5–8.1)

## 2022-11-08 LAB — URINALYSIS, ROUTINE W REFLEX MICROSCOPIC
Bacteria, UA: NONE SEEN
Bilirubin Urine: NEGATIVE
Glucose, UA: NEGATIVE mg/dL
Hgb urine dipstick: NEGATIVE
Ketones, ur: 40 mg/dL — AB
Nitrite: NEGATIVE
Protein, ur: NEGATIVE mg/dL
Specific Gravity, Urine: 1.016 (ref 1.005–1.030)
pH: 6.5 (ref 5.0–8.0)

## 2022-11-08 LAB — I-STAT VENOUS BLOOD GAS, ED
Acid-Base Excess: 0 mmol/L (ref 0.0–2.0)
Bicarbonate: 25 mmol/L (ref 20.0–28.0)
Calcium, Ion: 1.16 mmol/L (ref 1.15–1.40)
HCT: 42 % (ref 36.0–46.0)
Hemoglobin: 14.3 g/dL (ref 12.0–15.0)
O2 Saturation: 24 %
Patient temperature: 98.1
Potassium: 3.3 mmol/L — ABNORMAL LOW (ref 3.5–5.1)
Sodium: 121 mmol/L — ABNORMAL LOW (ref 135–145)
TCO2: 26 mmol/L (ref 22–32)
pCO2, Ven: 42.5 mmHg — ABNORMAL LOW (ref 44–60)
pH, Ven: 7.377 (ref 7.25–7.43)
pO2, Ven: 17 mmHg — CL (ref 32–45)

## 2022-11-08 LAB — LIPASE, BLOOD: Lipase: 14 U/L (ref 11–51)

## 2022-11-08 MED ORDER — TRAZODONE HCL 100 MG PO TABS
100.0000 mg | ORAL_TABLET | Freq: Every evening | ORAL | Status: DC | PRN
  Administered 2022-11-08 – 2022-11-09 (×2): 100 mg via ORAL
  Filled 2022-11-08 (×3): qty 1

## 2022-11-08 MED ORDER — GABAPENTIN 400 MG PO CAPS
400.0000 mg | ORAL_CAPSULE | Freq: Every day | ORAL | Status: DC
  Administered 2022-11-08 – 2022-11-09 (×2): 400 mg via ORAL
  Filled 2022-11-08 (×2): qty 1

## 2022-11-08 MED ORDER — TRIAMTERENE-HCTZ 37.5-25 MG PO CAPS: 1.0000 | ORAL_CAPSULE | Freq: Once | ORAL | Status: DC

## 2022-11-08 MED ORDER — PROMETHAZINE (PHENERGAN) 6.25MG IN NS 50ML IVPB: 6.2500 mg | Freq: Four times a day (QID) | INTRAVENOUS | Status: DC | PRN

## 2022-11-08 MED ORDER — ACETAMINOPHEN 650 MG RE SUPP: 650.0000 mg | Freq: Four times a day (QID) | RECTAL | Status: DC | PRN

## 2022-11-08 MED ORDER — HYDRALAZINE HCL 20 MG/ML IJ SOLN: 10.0000 mg | Freq: Four times a day (QID) | INTRAMUSCULAR | Status: DC | PRN

## 2022-11-08 MED ORDER — PROMETHAZINE (PHENERGAN) 6.25MG IN NS 50ML IVPB
6.2500 mg | Freq: Four times a day (QID) | INTRAVENOUS | Status: DC | PRN
  Administered 2022-11-08 – 2022-11-09 (×2): 6.25 mg via INTRAVENOUS
  Filled 2022-11-08: qty 50
  Filled 2022-11-08: qty 6.25
  Filled 2022-11-08: qty 50
  Filled 2022-11-08: qty 6.25

## 2022-11-08 MED ORDER — SODIUM CHLORIDE 0.9 % IV SOLN: Freq: Once | INTRAVENOUS | Status: AC

## 2022-11-08 MED ORDER — BUPROPION HCL ER (XL) 300 MG PO TB24
300.0000 mg | ORAL_TABLET | Freq: Every day | ORAL | Status: DC
  Administered 2022-11-09 – 2022-11-10 (×2): 300 mg via ORAL
  Filled 2022-11-08 (×2): qty 1

## 2022-11-08 MED ORDER — POLYETHYLENE GLYCOL 3350 17 G PO PACK: 17.0000 g | PACK | Freq: Every day | ORAL | Status: DC | PRN

## 2022-11-08 MED ORDER — ENOXAPARIN SODIUM 40 MG/0.4ML IJ SOSY
40.0000 mg | PREFILLED_SYRINGE | INTRAMUSCULAR | Status: DC
  Administered 2022-11-08 – 2022-11-09 (×2): 40 mg via SUBCUTANEOUS
  Filled 2022-11-08 (×2): qty 0.4

## 2022-11-08 MED ORDER — ACETAMINOPHEN 325 MG PO TABS: 650.0000 mg | ORAL_TABLET | Freq: Four times a day (QID) | ORAL | Status: DC | PRN

## 2022-11-08 MED ORDER — ALBUTEROL SULFATE (2.5 MG/3ML) 0.083% IN NEBU: 2.5000 mg | INHALATION_SOLUTION | RESPIRATORY_TRACT | Status: DC | PRN

## 2022-11-08 MED ORDER — POTASSIUM CHLORIDE 10 MEQ/100ML IV SOLN: 10.0000 meq | INTRAVENOUS | Status: DC

## 2022-11-08 MED ORDER — SODIUM CHLORIDE 0.9 % IV BOLUS
1000.0000 mL | Freq: Once | INTRAVENOUS | Status: AC
  Administered 2022-11-08: 1000 mL via INTRAVENOUS

## 2022-11-08 MED ORDER — METOCLOPRAMIDE HCL 5 MG/ML IJ SOLN
5.0000 mg | Freq: Once | INTRAMUSCULAR | Status: AC
  Administered 2022-11-08: 5 mg via INTRAVENOUS
  Filled 2022-11-08: qty 2

## 2022-11-08 MED ORDER — POTASSIUM CHLORIDE 10 MEQ/100ML IV SOLN
10.0000 meq | INTRAVENOUS | Status: DC
  Administered 2022-11-08: 10 meq via INTRAVENOUS
  Filled 2022-11-08 (×2): qty 100

## 2022-11-08 MED ORDER — ESCITALOPRAM OXALATE 10 MG PO TABS
5.0000 mg | ORAL_TABLET | Freq: Every day | ORAL | Status: DC
  Administered 2022-11-09 – 2022-11-10 (×2): 5 mg via ORAL
  Filled 2022-11-08 (×2): qty 1

## 2022-11-08 MED ORDER — DEXTROSE 5 % IV SOLN: INTRAVENOUS | Status: DC

## 2022-11-08 MED ORDER — DESMOPRESSIN ACETATE 4 MCG/ML IJ SOLN
1.0000 ug | Freq: Once | INTRAMUSCULAR | Status: AC
  Administered 2022-11-08: 1 ug via INTRAVENOUS
  Filled 2022-11-08: qty 1

## 2022-11-08 MED ORDER — SODIUM CHLORIDE 0.9 % IV SOLN: 12.5000 mg | Freq: Four times a day (QID) | INTRAVENOUS | Status: DC | PRN

## 2022-11-08 MED ORDER — DIPHENHYDRAMINE HCL 50 MG/ML IJ SOLN
12.5000 mg | Freq: Once | INTRAMUSCULAR | Status: AC
  Administered 2022-11-08: 12.5 mg via INTRAVENOUS
  Filled 2022-11-08: qty 1

## 2022-11-08 MED ORDER — OXYCODONE HCL 5 MG PO TABS: 5.0000 mg | ORAL_TABLET | ORAL | Status: DC | PRN

## 2022-11-08 MED ORDER — CARVEDILOL 25 MG PO TABS
25.0000 mg | ORAL_TABLET | Freq: Two times a day (BID) | ORAL | Status: DC
  Administered 2022-11-08 – 2022-11-10 (×4): 25 mg via ORAL
  Filled 2022-11-08 (×4): qty 1

## 2022-11-08 MED ORDER — HYOSCYAMINE SULFATE 0.125 MG SL SUBL
0.1250 mg | SUBLINGUAL_TABLET | Freq: Once | SUBLINGUAL | Status: AC
  Administered 2022-11-08: 0.125 mg via SUBLINGUAL
  Filled 2022-11-08: qty 1

## 2022-11-08 MED ORDER — GUAIFENESIN-DM 100-10 MG/5ML PO SYRP: 5.0000 mL | ORAL_SOLUTION | ORAL | Status: DC | PRN

## 2022-11-08 MED ORDER — TERAZOSIN HCL 2 MG PO CAPS: 2.0000 mg | ORAL_CAPSULE | Freq: Once | ORAL | Status: DC

## 2022-11-08 NOTE — ED Triage Notes (Signed)
Patient is currently covid positive. She was started on Paxlovid and developed increased abdominal pain, nausea, and headache. Rates pain a 7/10. Arrives with concerns of dehydration.

## 2022-11-08 NOTE — ED Notes (Signed)
Heather [name] with cl called for transport

## 2022-11-08 NOTE — ED Provider Notes (Signed)
[place] EMERGENCY DEPARTMENT AT [facility] Provider Note   CSN: [id number] Arrival date & time: 11/08/22  1030     History  Chief Complaint  Patient presents with   Covid Positive   Weakness   Vomiting    Heather [name] is a 62 y.o. female.  Who presents emergency department for nausea and vomiting.  Patient was diagnosed with COVID 19 virus.  Symptoms started on Tuesday.  She was.given Paxlovid on Wednesday, took her first dose and since that time has had intractable vomiting.  She took Zofran at home without relief of her symptoms.  She has diffuse abdominal cramping.  She denies diarrhea.  She has been unable to hold down food or fluids at home and feels very dehydrated   Weakness      Home Medications Prior to Admission medications   Medication Sig Start Date End Date Taking? Authorizing Provider  ALPRAZolam Prudy Feeler) 0.5 MG tablet 1 tablet 04/30/21  Yes [provider]  buPROPion (WELLBUTRIN XL) 300 MG 24 hr tablet Take 300 mg by mouth daily.   Yes [provider]  escitalopram (LEXAPRO) 5 MG tablet Take 5 mg by mouth daily.   Yes [provider]  gabapentin (NEURONTIN) 400 MG capsule TK 1 C PO QHS 03/08/17  Yes [provider]  simvastatin (ZOCOR) 10 MG tablet Take 10 mg by mouth daily.   Yes [provider]  terazosin (HYTRIN) 2 MG capsule Take 1 capsule by mouth daily. 04/27/18  Yes [provider]  triamterene-hydrochlorothiazide (DYAZIDE) 37.5-25 MG capsule Take 1 capsule by mouth every morning. 12/17/20  Yes [provider]  B Complex Vitamins (B COMPLEX PO) Take by mouth.    [provider]  Calcium-Magnesium-Vitamin D (CALCIUM MAGNESIUM PO) Take by mouth. Patient takes 1200 mg    [provider]  carvedilol (COREG) 25 MG tablet TK 1 T PO BID 04/03/15   [provider]  FOLIC ACID PO Take by mouth.    [provider]  Omega-3 Fatty Acids (OMEGA 3 PO) Take by  mouth.    [provider]  valACYclovir (VALTREX) 1000 MG tablet TAKE 2 TABLETS BY MOUTH TWICE DAILY FOR 1 DAY 10/09/21   Romualdo Bolk, MD      Allergies    Penicillins, Amlodipine, Erythromycin, Lisinopril, Codeine, and Sulfa antibiotics    Review of Systems   Review of Systems  Neurological:  Positive for weakness.    Physical Exam Updated Vital Signs BP (!) 140/125   Pulse 81   Temp 98.1 F (36.7 C)   Resp 15   Ht 5\' 6"  (1.676 m)   Wt 68 kg   LMP 11/06/2011   SpO2 100%   BMI 24.21 kg/m  Physical Exam Vitals and nursing note reviewed.  Constitutional:      General: She is not in acute distress.    Appearance: She is well-developed. She is not diaphoretic.  HENT:     Head: Normocephalic and atraumatic.     Right Ear: External ear normal.     Left Ear: External ear normal.     Nose: Nose normal.     Mouth/Throat:     Mouth: Mucous membranes are dry.  Eyes:     General: No scleral icterus.    Conjunctiva/sclera: Conjunctivae normal.  Cardiovascular:     Rate and Rhythm: Normal rate and regular rhythm.     Heart sounds: Normal heart sounds. No murmur heard.    No friction  rub. No gallop.  Pulmonary:     Effort: Pulmonary effort is normal. No respiratory distress.     Breath sounds: Normal breath sounds.  Abdominal:     General: Bowel sounds are normal. There is no distension.     Palpations: Abdomen is soft. There is no mass.     Tenderness: There is no abdominal tenderness. There is no guarding.  Musculoskeletal:     Cervical back: Normal range of motion.  Skin:    General: Skin is warm and dry.  Neurological:     Mental Status: She is alert and oriented to person, place, and time.  Psychiatric:        Behavior: Behavior normal.     ED Results / Procedures / Treatments   Labs (all labs ordered are listed, but only abnormal results are displayed) Labs Reviewed  CBC WITH DIFFERENTIAL/PLATELET - Abnormal; Notable for the following  components:      Result Value   WBC 2.2 (*)    Neutro Abs 0.6 (*)    All other components within normal limits  COMPREHENSIVE METABOLIC PANEL - Abnormal; Notable for the following components:   Sodium 116 (*)    Chloride 80 (*)    Glucose, Bld 103 (*)    AST 64 (*)    ALT 62 (*)    All other components within normal limits  URINALYSIS, ROUTINE W REFLEX MICROSCOPIC - Abnormal; Notable for the following components:   Ketones, ur 40 (*)    Leukocytes,Ua TRACE (*)    All other components within normal limits  I-STAT VENOUS BLOOD GAS, ED - Abnormal; Notable for the following components:   pCO2, Ven 42.5 (*)    pO2, Ven 17 (*)    Sodium 121 (*)    Potassium 3.3 (*)    All other components within normal limits  LIPASE, BLOOD    EKG None  Radiology No results found.  Procedures .Critical Care  Performed by: Arthor Captain, PA-C Authorized by: Arthor Captain, PA-C   Critical care provider statement:    Critical care time (minutes):  60   Critical care time was exclusive of:  Separately billable procedures and treating other patients   Critical care was necessary to treat or prevent imminent or life-threatening deterioration of the following conditions:  Metabolic crisis (hyponatremia)   Critical care was time spent personally by me on the following activities:  Development of treatment plan with patient or surrogate, discussions with consultants, evaluation of patient's response to treatment, examination of patient, ordering and review of laboratory studies, ordering and review of radiographic studies, ordering and performing treatments and interventions, pulse oximetry, re-evaluation of patient's condition and review of old charts   Care discussed with: admitting provider       Medications Ordered in ED Medications  0.9 %  sodium chloride infusion (has no administration in time range)  sodium chloride 0.9 % bolus 1,000 mL (0 mLs Intravenous Stopped 11/08/22 1324)   metoCLOPramide (REGLAN) injection 5 mg (5 mg Intravenous Given 11/08/22 1201)  diphenhydrAMINE (BENADRYL) injection 12.5 mg (12.5 mg Intravenous Given 11/08/22 1158)  hyoscyamine (LEVSIN SL) SL tablet 0.125 mg (0.125 mg Sublingual Given 11/08/22 1201)    ED Course/ Medical Decision Making/ A&P Clinical Course as of 11/08/22 1428  Sat Nov 08, 2022  1237 Sodium(!!): 116 [AH]    Clinical Course User Index [AH] Arthor Captain, PA-C  Medical Decision Making This patient presents to the ED for concern of vomiting, this involves an extensive number of treatment options, and is a complaint that carries with it a high risk of complications and morbidity.  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   Social Determinants of Health:       SDOH Screenings Tobacco Use: Low Risk  (11/08/2022)   Additional history:  {Additional history obtained from patient and husband   Lab Tests:  I Ordered, and personally interpreted labs.  The pertinent results include:   Sodium of 116 noted.  Mildly elevated AST and ALT of insignificant value CBC with white count of 2.2, hemoglobin 14.9.  I discussed the findings with the lab and they states that it appears she has a cold  agglutination causing abnormal results VBG to obtain hemoglobin shows accurate level at 14.3. Have no suspicion for hypoxia  Cardiac Monitoring/ECG:       The patient was maintained on a cardiac monitor.  I personally viewed and interpreted the cardiac monitored which showed an underlying rhythm of: Sinus rhythm  Medicines ordered and prescription drug management:  I ordered medication including Medications 0.9 %  sodium chloride infusion (has no administration in time range) sodium chloride 0.9 % bolus 1,000 mL (0 mLs Intravenous Stopped 11/08/22 1324)  metoCLOPramide (REGLAN) injection 5 mg (5 mg Intravenous Given 11/08/22 1201) diphenhydrAMINE (BENADRYL) injection 12.5 mg (12.5 mg Intravenous Given 11/08/22 1158) hyoscyamine (LEVSIN SL) SL tablet 0.125 mg (0.125 mg Sublingual Given 11/08/22 1201) for vomiting Reevaluation of the patient after these medicines showed that the patient resolution I have reviewed the patients home medicines and have made adjustments as needed  Test Considered:       ct abd however patient has a benign abdominal exam  Critical Interventions:       Sodium and chloride resuscitation  Consultations Obtained: [facility] for admission  Problem List / ED Course:       (E87.1) Hyponatremia  (primary encounter diagnosis)  (R11.2) Nausea and vomiting, unspecified vomiting type   MDM: Patient here with severe vomiting over the past several days.  She is notably hyponatremic.  She is not hyperreflexive and is not obtunded however will require admission for hyponatremia.  Case discussed with Dr. Erenest Blank who will admit the patient.   Dispostion:  After consideration of the diagnostic results and the patients response to treatment, I feel that the patent would benefit from admission.    Amount and/or Complexity of Data Reviewed Labs: ordered. Decision-making details documented in ED Course. ECG/medicine tests: ordered.  Risk Prescription  drug management. Decision regarding hospitalization.           Final Clinical Impression(s) / ED Diagnoses Final diagnoses:  Hyponatremia  Nausea and vomiting, unspecified vomiting type    Rx / DC Orders ED Discharge Orders     None         Arthor Captain, PA-C 11/08/22 1428    Trifan, Kermit Balo,  MD 11/08/22 1444

## 2022-11-08 NOTE — Plan of Care (Signed)
Plan of Care Note for Accepted Transfer   Patient: Heather [name]    ZOX:[id number]     Facility requesting transfer: [facility] Requesting Provider: Arthor Captain, PA-C  62 year old female recently diagnosed with COVID and started on Paxlovid, has been nauseated and vomiting admitted to the hospital with diffuse abdominal cramping.  Unable to keep down food.  Upon evaluation in the ER, found to have sodium of 116, sodium is slightly improved after a liter of LR.  Hospital admission requested for further treatment of her otherwise asymptomatic hyponatremia.  Accepted for inpatient admission, requested that patient be maintained on normal saline in the meantime.  Most recent vitals, labs and radiology:  Blood pressure (!) 140/125, pulse 81, temperature 98.1 F (36.7 C), resp. rate 15, height 5\' 6"  (1.676 m), weight 68 kg, last menstrual period 11/06/2011, SpO2 100%.      Latest Ref Rng & Units 11/08/2022    2:06 PM 11/08/2022   11:55 AM 08/07/2022   10:44 AM  CBC  WBC 4.0 - 10.5 K/uL  2.2  3.7   Hemoglobin 12.0 - 15.0 g/dL 81.1  91.4  78.2   Hematocrit 36.0 - 46.0 % 42.0  RESULTS UNAVAILABLE DUE TO INTERFERING SUBSTANCE  42.8    40.4   Platelets 150 - 400 K/uL  214  246       Latest Ref Rng & Units 11/08/2022    2:06 PM 11/08/2022   11:55 AM 08/07/2022   10:44 AM  BMP  Glucose 70 - 99 mg/dL  956  213   BUN 6 - 20 mg/dL  6  16   Creatinine 0.86 - 1.00 mg/dL  5.78  4.69   Sodium 629 - 145 mmol/L 121  116  137   Potassium 3.5 - 5.1 mmol/L 3.3  3.8  3.6   Chloride 98 - 111 mmol/L  80  96   CO2 22 - 32 mmol/L  26  33   Calcium 8.9 - 10.3 mg/dL  9.5  52.8      No results found.   The patient has been accepted for transfer to [facility] or [facility], depending on bed and resource availability. The patient will remain under the care and responsibility of the referring provider until they have arrived to our inpatient facility.  Author: Jianna Drabik Sharlette Dense, MD   11/08/2022  Check [email or web address] for on-call coverage.  Nursing staff, Please call [facility] Admits & Consults System-Wide number on Amion as soon as patient's arrival, so appropriate admitting provider can evaluate the pt.

## 2022-11-08 NOTE — ED Notes (Signed)
CRITICAL VALUE STICKER  CRITICAL VALUE:Na+ 116  RECEIVER (on-site recipient of call):Carmon Ginsberg, RN  DATE & TIME NOTIFIED:   MESSENGER (representative from lab):  MD NOTIFIED: P.A. Abby  TIME OF NOTIFICATION:  RESPONSE:

## 2022-11-08 NOTE — Progress Notes (Signed)
Placed Pt on 2L due to the VBG. Pt's SATS have been 100% since she checked in

## 2022-11-08 NOTE — H&P (Addendum)
HISTORY AND PHYSICAL    Heather [name]   AOZ:[id number] DOB: [birth year]   Date of Service: 11/08/22 Requesting physician/APP from ED: Treatment Team:  Attending Provider: Sunnie Nielsen, DO  PCP: Ileana Ladd, MD (Inactive)     HPI: Heather [name] is a 62 y.o. female with past medical history hypertension, anxiety.  Presents to [facility] ED for chief complaint nausea/vomiting.  Recently diagnosed with COVID-19 5 days ago, 4 days ago was prescribed Paxlovid and since taking first dose of that medication has had inability to eat/drink, significant nausea, recurrent vomiting.  Last episode of vomiting was yesterday.  Presented to the ED with weakness.  Occasional cough but no other respiratory symptoms, no shortness of breath.  No headache, vision changes, dizziness.  In the ED, was found to have significant hyponatremia, sodium 116.  Low WBC at 2.2, decreased neutrophils.  UA trace ketones.  Started on IV fluids.  Sodium increased to 121 over the span of about 2 hours.  Transferred from [facility] to [facility].     Consultants:  none  Procedures: none      ASSESSMENT & PLAN:   Principal Problem:   Hyponatremia Active Problems:   Essential hypertension, benign   Depression   Pure hypercholesterolemia   Sensorineural hearing loss (SNHL) of both ears   Sleep disturbance   Fibromyalgia   Hypokalemia  Hyponatremia Likely acute due to hypovolemia, vomiting, either symptom of COVID or side effect of Paxlovid, HCTZ also likely contributing Relatively rapid correction from 116-121 in the span of a few hours held IV fluids and repeated BMP - see below, sodium to 131 Goal for correction less than 10 in 24 hours Held thiazide diuretic Frequent monitoring BMP  Overcorrection hyponatremia Spoke w/ nephrology (Dr Signe Colt) re: overcorrection 116 earlier today to 131. Confirmed reversal plan as follows DDAVP 1 mcg D5W at 75 ml/h Follow BMP q2h for now   If not coming down, please reengage nephrology Signed out to night coverage   Positive COVID-19 No O2 requirement Albuterol as needed Cough symptomatic treatment with Robitussin DM as needed Will hold Paxlovid No need for steroids at this time  Hypokalemia Mild Replace as needed Follow BMP  Nausea/vomiting Symptomatic control Diet as able  Hypertension Hold thiazide Hydralazine as needed Continue home Coreg  Anxiety/depression Continue home medications with Lexapro, bupropion Patient requests something for sleep, trazodone ordered  Restless leg syndrome Continue home gabapentin nightly       DVT prophylaxis: lovenox  Pertinent IV fluids/nutrition: holding IV fluids for now, regular diet as able  Central lines / invasive devices: none  Code Status: FULL CODE Family Communication: husband at bedside on admission  Disposition: inpatient TOC needs: none at this time Barriers to discharge / significant pending items: Sodium correction, ability to tolerate diet, anticipate will be here 2 days or so            Review of Systems:  Review of Systems  Constitutional:  Positive for malaise/fatigue. Negative for chills, diaphoresis and fever.  HENT:  Positive for hearing loss (chronic). Negative for congestion, ear pain, sinus pain and tinnitus.   Eyes:  Negative for blurred vision and double vision.  Respiratory:  Positive for cough. Negative for sputum production and shortness of breath.   Cardiovascular:  Negative for chest pain, palpitations, claudication and leg swelling.  Gastrointestinal:  Positive for abdominal pain (cramping), nausea and vomiting. Negative for blood in stool, constipation, diarrhea and heartburn.  Genitourinary:  Negative for dysuria,  frequency and urgency.  Musculoskeletal:  Negative for back pain and myalgias.  Skin:  Negative for rash.  Neurological:  Negative for dizziness, focal weakness and headaches.       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  No current facility-administered medications on file prior to encounter.   Current Outpatient Medications on File Prior to Encounter  Medication Sig Dispense Refill   ALPRAZolam (XANAX) 0.5 MG tablet 1 tablet     buPROPion (WELLBUTRIN XL) 300 MG 24 hr tablet Take 300 mg by mouth daily.     escitalopram (LEXAPRO) 5 MG tablet Take 5 mg by mouth daily.     gabapentin (NEURONTIN) 400 MG capsule TK 1 C PO QHS  1   simvastatin (ZOCOR) 10 MG tablet Take 10 mg by mouth daily.     terazosin (HYTRIN) 2 MG capsule Take 1 capsule by mouth daily.     triamterene-hydrochlorothiazide (DYAZIDE) 37.5-25 MG capsule Take 1 capsule by mouth every morning.     B Complex Vitamins (B COMPLEX PO) Take by mouth.     Calcium-Magnesium-Vitamin D (CALCIUM MAGNESIUM PO) Take by mouth. Patient takes 1200 mg     carvedilol (COREG) 25 MG tablet TK 1 T PO BID  2   FOLIC ACID PO Take by mouth.     Omega-3 Fatty Acids (OMEGA 3 PO) Take by mouth.     valACYclovir (VALTREX) 1000 MG tablet TAKE 2 TABLETS BY MOUTH TWICE DAILY FOR 1 DAY 30 tablet 1     Allergies  Allergen Reactions   Penicillins Shortness Of Breath and Rash    Tightening of throat   Amlodipine    Erythromycin     GI Upset   Lisinopril Cough   Codeine Rash   Sulfa Antibiotics Rash      family history includes Breast cancer (age of onset: 72) in her maternal aunt; Depression in her father, mother, and paternal grandmother; Hypertension in her father, maternal grandfather, maternal grandmother, and mother; Osteoporosis (age of onset: 61) in her mother. Past Surgical History:  Procedure Laterality Date   BLADDER SUSPENSION  2010   BUNIONECTOMY Left 04/15/2016   DIAGNOSTIC LAPAROSCOPY  1994    Normal   TUBAL LIGATION  2004           Objective Findings:  Vitals:   11/08/22 1445 11/08/22 1615 11/08/22 1630 11/08/22 1717  BP: (!) 153/104 (!) 154/102 (!) 152/83 (!) 146/90  Pulse: 71 70 72 73  Resp: 15 (!) 23 14   Temp: 98.4 F (36.9 C)   98.6 F (37 C)  TempSrc: Oral   Oral  SpO2: 100% 100% 100%   Weight:      Height:        Intake/Output Summary (Last 24 hours) at 11/08/2022 1927 Last data filed at 11/08/2022 1718 Gross per 24 hour  Intake 1228.85 ml  Output 1 ml  Net 1227.85 ml   Filed Weights   11/08/22 1100  Weight: 68 kg    Examination:  Physical Exam Constitutional:      General: She is not in acute distress.    Appearance: She is normal weight. She is not ill-appearing.  Cardiovascular:     Rate and Rhythm: Normal rate and regular rhythm.     Heart sounds: Normal heart sounds.  Pulmonary:     Effort: Pulmonary effort is normal. No  respiratory distress.     Breath sounds: Normal breath sounds.  Abdominal:     General: Abdomen is flat. Bowel sounds are normal.     Palpations: Abdomen is soft.  Musculoskeletal:     Right lower leg: No edema.     Left lower leg: No edema.  Skin:    General: Skin is warm and dry.  Neurological:     General: No focal deficit present.     Mental Status: She is alert and oriented to person, place, and time.     Cranial Nerves: No cranial nerve deficit.  Psychiatric:        Mood and Affect: Mood normal.        Behavior: Behavior normal.        Thought Content: Thought content normal.        Judgment: Judgment normal.          Scheduled Medications:   buPROPion  300 mg Oral Daily   carvedilol  25 mg Oral BID   enoxaparin (LOVENOX) injection  40 mg Subcutaneous Q24H   escitalopram  5 mg Oral Daily   gabapentin  400 mg Oral QHS    Continuous Infusions:  promethazine (PHENERGAN) injection (IM or IVPB)     Or   promethazine (PHENERGAN) injection (IM or IVPB)      PRN Medications:  acetaminophen **OR** acetaminophen, albuterol,  guaiFENesin-dextromethorphan, hydrALAZINE, oxyCODONE, polyethylene glycol, promethazine (PHENERGAN) injection (IM or IVPB) **OR** promethazine (PHENERGAN) injection (IM or IVPB), traZODone  Antimicrobials:  Anti-infectives (From admission, onward)    None           Data Reviewed: I have personally reviewed following labs and imaging studies  CBC: Recent Labs  Lab 11/08/22 1155 11/08/22 1406  WBC 2.2*  --   NEUTROABS 0.6*  --   HGB 14.9 14.3  HCT RESULTS UNAVAILABLE DUE TO INTERFERING SUBSTANCE 42.0  MCV RESULTS UNAVAILABLE DUE TO INTERFERING SUBSTANCE  --   PLT 214  --    Basic Metabolic Panel: Recent Labs  Lab 11/08/22 1155 11/08/22 1406  NA 116* 121*  K 3.8 3.3*  CL 80*  --   CO2 26  --   GLUCOSE 103*  --   BUN 6  --   CREATININE 0.69  --   CALCIUM 9.5  --    GFR: Estimated Creatinine Clearance: 70 mL/min (by C-G formula based on SCr of 0.69 mg/dL). Liver Function Tests: Recent Labs  Lab 11/08/22 1155  AST 64*  ALT 62*  ALKPHOS 50  BILITOT 0.5  PROT 7.5  ALBUMIN 4.9   Recent Labs  Lab 11/08/22 1155  LIPASE 14   No results for input(s): "AMMONIA" in the last 168 hours. Coagulation Profile: No results for input(s): "INR", "PROTIME" in the last 168 hours. Cardiac Enzymes: No results for input(s): "CKTOTAL", "CKMB", "CKMBINDEX", "TROPONINI" in the last 168 hours. BNP (last 3 results) No results for input(s): "PROBNP" in the last 8760 hours. HbA1C: No results for input(s): "HGBA1C" in the last 72 hours. CBG: No results for input(s): "GLUCAP" in the last 168 hours. Lipid Profile: No results for input(s): "CHOL", "HDL", "LDLCALC", "TRIG", "CHOLHDL", "LDLDIRECT" in the last 72 hours. Thyroid Function Tests: No results for input(s): "TSH", "T4TOTAL", "FREET4", "T3FREE", "THYROIDAB" in the last 72 hours. Anemia Panel: No results for input(s): "VITAMINB12", "FOLATE", "FERRITIN", "TIBC", "IRON", "RETICCTPCT" in the last 72 hours. Most Recent  Urinalysis On File:     Component Value Date/Time   COLORURINE YELLOW 11/08/2022 1155  APPEARANCEUR CLEAR 11/08/2022 1155   LABSPEC 1.016 11/08/2022 1155   PHURINE 6.5 11/08/2022 1155   GLUCOSEU NEGATIVE 11/08/2022 1155   HGBUR NEGATIVE 11/08/2022 1155   BILIRUBINUR NEGATIVE 11/08/2022 1155   BILIRUBINUR neg 01/09/2014 1238   KETONESUR 40 (A) 11/08/2022 1155   PROTEINUR NEGATIVE 11/08/2022 1155   UROBILINOGEN negative 01/09/2014 1238   UROBILINOGEN 0.2 10/18/2007 0918   NITRITE NEGATIVE 11/08/2022 1155   LEUKOCYTESUR TRACE (A) 11/08/2022 1155   Sepsis Labs: @LABRCNTIP (procalcitonin:4,lacticidven:4)  No results found for this or any previous visit (from the past 240 hour(s)).       Radiology Studies: No results found.           LOS: 0 days        Sunnie Nielsen, DO [facility] 11/08/2022, 7:27 PM    Dictation software may have been used to generate the above note. Typos may occur and escape review in typed/dictated notes. Please contact Dr Lyn Hollingshead directly for clarity if needed.  Staff may message me via secure chat in Epic  but this may not receive an immediate response,  please page me for urgent matters!  If 7PM-7AM, please contact night coverage [email or web address]

## 2022-11-09 DIAGNOSIS — E871 Hypo-osmolality and hyponatremia: Secondary | ICD-10-CM | POA: Diagnosis not present

## 2022-11-09 LAB — BASIC METABOLIC PANEL WITH GFR
Anion gap: 11 (ref 5–15)
Anion gap: 10 (ref 5–15)
Anion gap: 10 (ref 5–15)
Anion gap: 8 (ref 5–15)
Anion gap: 11 (ref 5–15)
BUN: 7 mg/dL (ref 6–20)
BUN: 7 mg/dL (ref 6–20)
BUN: 7 mg/dL (ref 6–20)
BUN: 7 mg/dL (ref 6–20)
BUN: 8 mg/dL (ref 6–20)
CO2: 24 mmol/L (ref 22–32)
CO2: 23 mmol/L (ref 22–32)
CO2: 23 mmol/L (ref 22–32)
CO2: 24 mmol/L (ref 22–32)
CO2: 23 mmol/L (ref 22–32)
Calcium: 8.9 mg/dL (ref 8.9–10.3)
Calcium: 8.8 mg/dL — ABNORMAL LOW (ref 8.9–10.3)
Calcium: 8.9 mg/dL (ref 8.9–10.3)
Calcium: 8.8 mg/dL — ABNORMAL LOW (ref 8.9–10.3)
Calcium: 9 mg/dL (ref 8.9–10.3)
Chloride: 95 mmol/L — ABNORMAL LOW (ref 98–111)
Chloride: 95 mmol/L — ABNORMAL LOW (ref 98–111)
Chloride: 96 mmol/L — ABNORMAL LOW (ref 98–111)
Chloride: 98 mmol/L (ref 98–111)
Chloride: 96 mmol/L — ABNORMAL LOW (ref 98–111)
Creatinine, Ser: 0.64 mg/dL (ref 0.44–1.00)
Creatinine, Ser: 0.66 mg/dL (ref 0.44–1.00)
Creatinine, Ser: 0.73 mg/dL (ref 0.44–1.00)
Creatinine, Ser: 0.7 mg/dL (ref 0.44–1.00)
Creatinine, Ser: 0.63 mg/dL (ref 0.44–1.00)
GFR, Estimated: >60 mL/min (ref >60)
GFR, Estimated: >60 mL/min (ref >60)
GFR, Estimated: >60 mL/min (ref >60)
GFR, Estimated: >60 mL/min (ref >60)
GFR, Estimated: >60 mL/min (ref >60)
Glucose, Bld: 102 mg/dL — ABNORMAL HIGH (ref 70–99)
Glucose, Bld: 117 mg/dL — ABNORMAL HIGH (ref 70–99)
Glucose, Bld: 94 mg/dL (ref 70–99)
Glucose, Bld: 91 mg/dL (ref 70–99)
Glucose, Bld: 92 mg/dL (ref 70–99)
Potassium: 2.7 mmol/L — CL (ref 3.5–5.1)
Potassium: 2.8 mmol/L — ABNORMAL LOW (ref 3.5–5.1)
Potassium: 3.1 mmol/L — ABNORMAL LOW (ref 3.5–5.1)
Potassium: 3.2 mmol/L — ABNORMAL LOW (ref 3.5–5.1)
Potassium: 3.5 mmol/L (ref 3.5–5.1)
Sodium: 130 mmol/L — ABNORMAL LOW (ref 135–145)
Sodium: 128 mmol/L — ABNORMAL LOW (ref 135–145)
Sodium: 129 mmol/L — ABNORMAL LOW (ref 135–145)
Sodium: 130 mmol/L — ABNORMAL LOW (ref 135–145)
Sodium: 130 mmol/L — ABNORMAL LOW (ref 135–145)

## 2022-11-09 LAB — BASIC METABOLIC PANEL
Anion gap: 8 (ref 5–15)
BUN: 8 mg/dL (ref 6–20)
CO2: 23 mmol/L (ref 22–32)
Calcium: 8.8 mg/dL — ABNORMAL LOW (ref 8.9–10.3)
Chloride: 99 mmol/L (ref 98–111)
Creatinine, Ser: 0.69 mg/dL (ref 0.44–1.00)
GFR, Estimated: >60 mL/min (ref >60)
Glucose, Bld: 82 mg/dL (ref 70–99)
Potassium: 4.1 mmol/L (ref 3.5–5.1)
Sodium: 130 mmol/L — ABNORMAL LOW (ref 135–145)

## 2022-11-09 LAB — MAGNESIUM: Magnesium: 2.1 mg/dL (ref 1.7–2.4)

## 2022-11-09 MED ORDER — POTASSIUM CHLORIDE CRYS ER 20 MEQ PO TBCR
40.0000 meq | EXTENDED_RELEASE_TABLET | ORAL | Status: DC
  Administered 2022-11-09: 40 meq via ORAL
  Filled 2022-11-09: qty 2

## 2022-11-09 MED ORDER — LOPERAMIDE HCL 2 MG PO CAPS
2.0000 mg | ORAL_CAPSULE | ORAL | Status: DC | PRN
  Administered 2022-11-09: 2 mg via ORAL
  Filled 2022-11-09: qty 1

## 2022-11-09 MED ORDER — POTASSIUM CHLORIDE CRYS ER 20 MEQ PO TBCR
40.0000 meq | EXTENDED_RELEASE_TABLET | Freq: Once | ORAL | Status: AC
  Administered 2022-11-09: 40 meq via ORAL
  Filled 2022-11-09: qty 2

## 2022-11-09 MED ORDER — POTASSIUM CHLORIDE CRYS ER 20 MEQ PO TBCR: 40.0000 meq | EXTENDED_RELEASE_TABLET | ORAL | Status: DC

## 2022-11-09 NOTE — Progress Notes (Signed)
PROGRESS NOTE    Heather [name]  WUX:[id number] DOB: Mar 22, 1961 DOA: 11/08/2022 PCP: Ileana Ladd, MD (Inactive)    Brief Narrative:   Heather [name] is a 62 y.o. female with past medical history significant for HTN, anxiety, neutropenia/leukopenia, depression, fibromyalgia, hearing loss who presented to [facility] ED on 8/3 with progressive abdominal pain associate with nausea/vomiting.  Unable to tolerate p.o. intake.  Recently diagnosed with COVID-19 5 days prior to hospitalization and was prescribed Paxlovid.  Since taking this medication has had inability to eat/drink associate with significant nausea and vomiting.  Also endorses generalized weakness, occasional cough but denies shortness of breath.  In the ED, temperature 98.1 F, HR 72, RR 20, BP 149/107, SpO2 100% on room air.  WBC 2.2, hemoglobin 14.9.  Sodium 116, potassium 3.8, chloride 80, CO2 26, glucose 103, BUN 6, creatinine 0.69.  Lipase 14, AST 64, ALT 62, total bilirubin 0.5.  Urinalysis with trace leukocytes, otherwise unrevealing.  Patient was started on IV fluids with sodium increasing to 121 over the span of 2 hours.  TRH was consulted for admission and [facility] was [facility] to [facility] for further evaluation and management of severe hyponatremia likely secondary to hypovolemia in the setting of persistent nausea/vomiting from Paxlovid use.  Assessment & Plan:   Severe hyponatremia Patient presenting to ED with generalized weakness, progressive abdominal pain associated with nausea and vomiting.  In the ED was noted to have a low sodium of 116.  Patient was started on IV fluids in the ED with relative rapid correction to 121.  IV fluids were held and further repeat BMP showing sodium up to 131.  Admitting physician discussed case with nephrology, Dr. Signe Colt for concern of overcorrection and patient was started on reversal with DDAVP 1 mcg and given D5W.  Patient is asymptomatic  with stabilization of sodium between 129-130. -- Continue to hold home thiazide diuretic -- Continue to hold further IV fluids -- Repeat BMP at 1500  Hypokalemia Etiology likely secondary to GI loss.  Will continue repletion. -- Repeat BMP as above  COVID-19 viral infection Recently diagnosed with COVID-19 5 days prior to admission and was started on Paxlovid.  Unfortunately had significant side effects associate with nausea and vomiting causing abdominal pain.  Patient is otherwise asymptomatic, no shortness of breath. -- Supportive care -- Continue airborne/contact isolation  Essential hypertension -- Holding home triamterene-hydrochlorothiazide as above -- Continue carvedilol 25 mg p.o. twice daily  Neutropenia/leukopenia Followed by hematology outpatient, Dr. Al Pimple.  Currently on surveillance.  Anxiety/depression -- Lexapro 5 mg p.o. daily -- Wellbutrin 300 mg p.o. daily  Fibromyalgia Restless leg syndrome -- Gabapentin 400 mg p.o. nightly -- Oxycodone 5 mg p.o. every 4 hours as needed moderate pain  Hearing loss Follows with audiology outpatient  DVT prophylaxis: enoxaparin (LOVENOX) injection 40 mg Start: 11/08/22 2200    Code Status: Full Code Family Communication: Updated spouse present at bedside this morning  Disposition Plan:  Level of care: Progressive Status is: Inpatient Remains inpatient appropriate because: Pending further surveillance of sodium level, anticipate discharge home later today versus tomorrow    Consultants:  Case discussed with nephrology, Dr. Signe Colt by admitting physician  Procedures:  None  Antimicrobials:  None   Subjective: Seen examined bedside, resting comfortably.  Spouse present at bedside.  Sleeping but arousable.  Overall feels much improved.  Denies abdominal pain.  No other specific complaints or concerns at this time.  Denies headache, no dizziness,  no chest pain, no palpitations, no shortness of breath, no  fever/chills/night sweats, no current nausea/vomiting, no diarrhea, no focal weakness, no fatigue, no paresthesias.  No acute events overnight per nurse staff.  Objective: Vitals:   11/08/22 1717 11/08/22 2149 11/09/22 0124 11/09/22 0511  BP: (!) 146/90 131/82 128/79 (!) 146/85  Pulse: 73 66 67 61  Resp:  20 13 16   Temp: 98.6 F (37 C) 98.4 F (36.9 C) 98.2 F (36.8 C) 98.4 F (36.9 C)  TempSrc: Oral Oral Oral Oral  SpO2:  99% 99% 97%  Weight:      Height:        Intake/Output Summary (Last 24 hours) at 11/09/2022 1120 Last data filed at 11/09/2022 0400 Gross per 24 hour  Intake 1510.24 ml  Output 1 ml  Net 1509.24 ml   Filed Weights   11/08/22 1100  Weight: 68 kg    Examination:  Physical Exam: GEN: NAD, alert and oriented x 3, wd/wn HEENT: NCAT, PERRL, EOMI, sclera clear, MMM PULM: CTAB w/o wheezes/crackles, normal respiratory effort, on room air CV: RRR w/o M/G/R GI: abd soft, NTND, NABS, no R/G/M MSK: no peripheral edema, muscle strength globally intact 5/5 bilateral upper/lower extremities NEURO: CN II-XII intact, no focal deficits, sensation to light touch intact PSYCH: normal mood/affect Integumentary: dry/intact, no rashes or wounds    Data Reviewed: I have personally reviewed following labs and imaging studies  CBC: Recent Labs  Lab 11/08/22 1155 11/08/22 1406  WBC 2.2*  --   NEUTROABS 0.6*  --   HGB 14.9 14.3  HCT RESULTS UNAVAILABLE DUE TO INTERFERING SUBSTANCE 42.0  MCV RESULTS UNAVAILABLE DUE TO INTERFERING SUBSTANCE  --   PLT 214  --    Basic Metabolic Panel: Recent Labs  Lab 11/08/22 2352 11/09/22 0109 11/09/22 0122 11/09/22 0341 11/09/22 0602 11/09/22 0940  NA 130* 128*  --  129* 130* 130*  K 2.7* 2.8*  --  3.1* 3.2* 3.5  CL 95* 95*  --  96* 98 96*  CO2 24 23  --  23 24 23   GLUCOSE 102* 117*  --  94 91 92  BUN 7 7  --  7 7 8   CREATININE 0.64 0.66  --  0.73 0.70 0.63  CALCIUM 8.9 8.8*  --  8.9 8.8* 9.0  MG  --   --  2.1  --   --    --    GFR: Estimated Creatinine Clearance: 70 mL/min (by C-G formula based on SCr of 0.63 mg/dL). Liver Function Tests: Recent Labs  Lab 11/08/22 1155  AST 64*  ALT 62*  ALKPHOS 50  BILITOT 0.5  PROT 7.5  ALBUMIN 4.9   Recent Labs  Lab 11/08/22 1155  LIPASE 14   No results for input(s): "AMMONIA" in the last 168 hours. Coagulation Profile: No results for input(s): "INR", "PROTIME" in the last 168 hours. Cardiac Enzymes: No results for input(s): "CKTOTAL", "CKMB", "CKMBINDEX", "TROPONINI" in the last 168 hours. BNP (last 3 results) No results for input(s): "PROBNP" in the last 8760 hours. HbA1C: No results for input(s): "HGBA1C" in the last 72 hours. CBG: No results for input(s): "GLUCAP" in the last 168 hours. Lipid Profile: No results for input(s): "CHOL", "HDL", "LDLCALC", "TRIG", "CHOLHDL", "LDLDIRECT" in the last 72 hours. Thyroid Function Tests: No results for input(s): "TSH", "T4TOTAL", "FREET4", "T3FREE", "THYROIDAB" in the last 72 hours. Anemia Panel: No results for input(s): "VITAMINB12", "FOLATE", "FERRITIN", "TIBC", "IRON", "RETICCTPCT" in the last 72 hours. Sepsis Labs: No  results for input(s): "PROCALCITON", "LATICACIDVEN" in the last 168 hours.  No results found for this or any previous visit (from the past 240 hour(s)).       Radiology Studies: No results found.      Scheduled Meds:  buPROPion  300 mg Oral Daily   carvedilol  25 mg Oral BID   enoxaparin (LOVENOX) injection  40 mg Subcutaneous Q24H   escitalopram  5 mg Oral Daily   gabapentin  400 mg Oral QHS   Continuous Infusions:  promethazine (PHENERGAN) injection (IM or IVPB) 6.25 mg (11/08/22 2133)   Or   promethazine (PHENERGAN) injection (IM or IVPB)       LOS: 1 day    Time spent: 51 minutes spent on chart review, discussion with nursing staff, consultants, updating family and interview/physical exam; more than 50% of that time was spent in counseling and/or coordination of  care.    Alvira Philips [place], DO [facility] Available via Epic secure chat 7am-7pm After these hours, please refer to coverage provider listed on [email or web address] 11/09/2022, 11:20 AM

## 2022-11-10 DIAGNOSIS — E871 Hypo-osmolality and hyponatremia: Secondary | ICD-10-CM | POA: Diagnosis not present

## 2022-11-10 MED ORDER — SODIUM CHLORIDE 0.9 % IV SOLN: INTRAVENOUS | Status: DC

## 2022-11-10 MED ORDER — PROMETHAZINE HCL 12.5 MG PO TABS: 12.5000 mg | ORAL_TABLET | Freq: Four times a day (QID) | ORAL | 0 refills | Status: DC | PRN

## 2022-11-10 MED ORDER — LOPERAMIDE HCL 2 MG PO TABS: 2.0000 mg | ORAL_TABLET | Freq: Four times a day (QID) | ORAL | 0 refills | Status: DC | PRN

## 2022-11-10 NOTE — Discharge Instructions (Signed)
Discontinue home BP medication triamterene/hydrochlorothiazide. Follow-up with your primary care physician in 1-2 weeks for repeat labs

## 2022-11-10 NOTE — Discharge Summary (Signed)
Physician Discharge Summary  Heather [name] ZOX:[id number] DOB: [birth year] DOA: 11/08/2022  PCP: Ileana Ladd, MD (Inactive)  Admit date: 11/08/2022 Discharge date: 11/10/2022  Admitted From: Home Disposition: Home  Recommendations for Outpatient Follow-up:  Follow up with PCP in 1-2 weeks Discontinue triamterene/HCTZ given severe hyponatremia Please obtain BMP in one week to reassess sodium level and potassium level May benefit from also checking her electrolytes in 1 month after she exercises that she reports myalgias following workout sessions to see if she would benefit from some mild daily electrolyte replacement  Home Health: No Equipment/Devices: None  Discharge Condition: Stable CODE STATUS: Full code Diet recommendation: Heart healthy diet  History of present illness:  Heather [name] is a 62 y.o. female with past medical history significant for HTN, anxiety, neutropenia/leukopenia, depression, fibromyalgia, hearing loss who presented to [facility] ED on 8/3 with progressive abdominal pain associate with nausea/vomiting.  Unable to tolerate p.o. intake.  Recently diagnosed with COVID-19 5 days prior to hospitalization and was prescribed Paxlovid.  Since taking this medication has had inability to eat/drink associate with significant nausea and vomiting.  Also endorses generalized weakness, occasional cough but denies shortness of breath.   In the ED, temperature 98.1 F, HR 72, RR 20, BP 149/107, SpO2 100% on room air.  WBC 2.2, hemoglobin 14.9.  Sodium 116, potassium 3.8, chloride 80, CO2 26, glucose 103, BUN 6, creatinine 0.69.  Lipase 14, AST 64, ALT 62, total bilirubin 0.5.  Urinalysis with trace leukocytes, otherwise unrevealing.  Heather [name] was started on IV fluids with sodium increasing to 121 over the span of 2 hours.  TRH was consulted for admission and Heather [name] was [facility] for further evaluation and management of  severe hyponatremia likely secondary to hypovolemia in the setting of persistent nausea/vomiting from Paxlovid use.  Hospital course:  Severe hyponatremia Heather [name] presenting to ED with generalized weakness, progressive abdominal pain associated with nausea and vomiting.  In the ED was noted to have a low sodium of 116.  Heather [name] was started on IV fluids in the ED with relative rapid correction to 121.  IV fluids were held and further repeat BMP showing sodium up to 131.  Admitting physician discussed case with nephrology, Dr. Signe Colt for concern of overcorrection and Heather [name] was started on reversal with DDAVP 1 mcg and given D5W.  Heather [name] is asymptomatic with stabilization of sodium between 127-130.  Heather [name] tolerating oral intake.  Will discontinue her home triamterene/HCTZ for now.  Recommend follow-up with her PCP in 1 week for repeat labs.   Hypokalemia Repleted during hospitalization.  Potassium 3.6 at time of discharge.   COVID-19 viral infection Recently diagnosed with COVID-19 5 days prior to admission and was started on Paxlovid.  Unfortunately had significant side effects associate with nausea and vomiting causing abdominal pain.  Heather [name] is otherwise asymptomatic, no shortness of breath.   Essential hypertension Home triamterene-hydrochlorothiazide discontinued as above. Continue carvedilol 25 mg p.o. twice daily   Neutropenia/leukopenia Followed by hematology outpatient, Dr. Al Pimple.  Currently on surveillance.   Anxiety/depression Continue Lexapro 5 mg p.o. daily, Wellbutrin 300 mg p.o. daily   Fibromyalgia Restless leg syndrome Gabapentin 400 mg p.o. nightly, Oxycodone 5 mg p.o. every 4 hours as needed moderate pain   Hearing loss Follows with audiology outpatient  Discharge Diagnoses:  Principal Problem:   Hyponatremia Active Problems:   Essential hypertension, benign   Depression   Pure hypercholesterolemia   Sensorineural hearing loss (SNHL) of  both ears   Sleep  disturbance   Fibromyalgia   Hypokalemia    Discharge Instructions  Discharge Instructions     Call MD for:  difficulty breathing, headache or visual disturbances   Complete by: As directed    Call MD for:  extreme fatigue   Complete by: As directed    Call MD for:  persistant dizziness or light-headedness   Complete by: As directed    Call MD for:  persistant nausea and vomiting   Complete by: As directed    Call MD for:  severe uncontrolled pain   Complete by: As directed    Call MD for:  temperature >100.4   Complete by: As directed    Diet - low sodium heart healthy   Complete by: As directed    Increase activity slowly   Complete by: As directed       Allergies as of 11/10/2022       Reactions   Penicillins Shortness Of Breath, Rash   Tightening of throat   Amlodipine    Erythromycin    GI Upset   Lisinopril Cough   Sulfa Antibiotics Rash        Medication List     STOP taking these medications    triamterene-hydrochlorothiazide 37.5-25 MG capsule Commonly known as: DYAZIDE   valACYclovir 1000 MG tablet Commonly known as: VALTREX       TAKE these medications    B COMPLEX PO Take 1 tablet by mouth daily.   buPROPion 300 MG 24 hr tablet Commonly known as: WELLBUTRIN XL Take 300 mg by mouth daily.   CALCIUM MAGNESIUM PO Take 1,200 mg by mouth daily. Heather [name] takes 1200 mg   carvedilol 25 MG tablet Commonly known as: COREG Take 25 mg by mouth 2 (two) times daily with a meal.   escitalopram 5 MG tablet Commonly known as: LEXAPRO Take 5 mg by mouth daily.   FOLIC ACID PO Take 1 capsule by mouth daily.   gabapentin 400 MG capsule Commonly known as: NEURONTIN Take 400 mg by mouth at bedtime.   loperamide 2 MG tablet Commonly known as: Imodium A-D Take 1 tablet (2 mg total) by mouth 4 (four) times daily as needed for diarrhea or loose stools.   OMEGA 3 PO Take 1 capsule by mouth daily.   promethazine 12.5 MG tablet Commonly known  as: PHENERGAN Take 1 tablet (12.5 mg total) by mouth every 6 (six) hours as needed for nausea or vomiting.   simvastatin 10 MG tablet Commonly known as: ZOCOR Take 10 mg by mouth daily.   terazosin 2 MG capsule Commonly known as: HYTRIN Take 2 mg by mouth daily.        Follow-up Information     Ileana Ladd, MD. Schedule an appointment as soon as possible for a visit in 1 week(s).   Specialty: Family Medicine Why: Need repeat BMP lab for recheck of sodium and potassium level Contact information: [address] [place] [address] [phone]                Allergies  Allergen Reactions   Penicillins Shortness Of Breath and Rash    Tightening of throat   Amlodipine    Erythromycin     GI Upset   Lisinopril Cough   Sulfa Antibiotics Rash    Consultations: Case discussed with nephrology, Dr. Signe Colt by admitting physician    Procedures/Studies: No results found.   Subjective: Heather [name] seen examined at bedside, resting  calmly.  Sitting in bedside chair.  Husband present.  Overall feels well and ready for discharge home.  No other specific complaints or concerns at this time.  Denies headache, no dizziness, no chest pain, no palpitations, no shortness of breath, no abdominal pain, no fever/chills/night sweats, no nausea/vomiting/diarrhea, no focal weakness, no fatigue, no paresthesias.  No acute events overnight per nursing.  Discharge Exam: Vitals:   11/09/22 1415 11/09/22 1936  BP: (!) 145/95 134/85  Pulse: 68 68  Resp:    Temp: 99.1 F (37.3 C) 98.7 F (37.1 C)  SpO2: 99% 100%   Vitals:   11/09/22 0124 11/09/22 0511 11/09/22 1415 11/09/22 1936  BP: 128/79 (!) 146/85 (!) 145/95 134/85  Pulse: 67 61 68 68  Resp: 13 16    Temp: 98.2 F (36.8 C) 98.4 F (36.9 C) 99.1 F (37.3 C) 98.7 F (37.1 C)  TempSrc: Oral Oral Oral Oral  SpO2: 99% 97% 99% 100%  Weight:      Height:        Physical Exam: GEN: NAD, alert and oriented x 3,  wd/wn HEENT: NCAT, PERRL, EOMI, sclera clear, MMM PULM: CTAB w/o wheezes/crackles, normal respiratory effort, on room air CV: RRR w/o M/G/R GI: abd soft, NTND, NABS, no R/G/M MSK: no peripheral edema, muscle strength globally intact 5/5 bilateral upper/lower extremities NEURO: CN II-XII intact, no focal deficits, sensation to light touch intact PSYCH: normal mood/affect Integumentary: dry/intact, no rashes or wounds    The results of significant diagnostics from this hospitalization (including imaging, microbiology, ancillary and laboratory) are listed below for reference.     Microbiology: No results found for this or any previous visit (from the past 240 hour(s)).   Labs: BNP (last 3 results) No results for input(s): "BNP" in the last 8760 hours. Basic Metabolic Panel: Recent Labs  Lab 11/09/22 0122 11/09/22 0341 11/09/22 0602 11/09/22 0940 11/09/22 1459 11/10/22 0443  NA  --  129* 130* 130* 130* 127*  K  --  3.1* 3.2* 3.5 4.1 3.6  CL  --  96* 98 96* 99 97*  CO2  --  23 24 23 23 22   GLUCOSE  --  94 91 92 82 93  BUN  --  7 7 8 8 7   CREATININE  --  0.73 0.70 0.63 0.69 0.62  CALCIUM  --  8.9 8.8* 9.0 8.8* 8.8*  MG 2.1  --   --   --   --  2.1   Liver Function Tests: Recent Labs  Lab 11/08/22 1155  AST 64*  ALT 62*  ALKPHOS 50  BILITOT 0.5  PROT 7.5  ALBUMIN 4.9   Recent Labs  Lab 11/08/22 1155  LIPASE 14   No results for input(s): "AMMONIA" in the last 168 hours. CBC: Recent Labs  Lab 11/08/22 1155 11/08/22 1406  WBC 2.2*  --   NEUTROABS 0.6*  --   HGB 14.9 14.3  HCT RESULTS UNAVAILABLE DUE TO INTERFERING SUBSTANCE 42.0  MCV RESULTS UNAVAILABLE DUE TO INTERFERING SUBSTANCE  --   PLT 214  --    Cardiac Enzymes: No results for input(s): "CKTOTAL", "CKMB", "CKMBINDEX", "TROPONINI" in the last 168 hours. BNP: Invalid input(s): "POCBNP" CBG: No results for input(s): "GLUCAP" in the last 168 hours. D-Dimer No results for input(s): "DDIMER" in the  last 72 hours. Hgb A1c No results for input(s): "HGBA1C" in the last 72 hours. Lipid Profile No results for input(s): "CHOL", "HDL", "LDLCALC", "TRIG", "CHOLHDL", "LDLDIRECT" in the last 72 hours.  Thyroid function studies No results for input(s): "TSH", "T4TOTAL", "T3FREE", "THYROIDAB" in the last 72 hours.  Invalid input(s): "FREET3" Anemia work up No results for input(s): "VITAMINB12", "FOLATE", "FERRITIN", "TIBC", "IRON", "RETICCTPCT" in the last 72 hours. Urinalysis    Component Value Date/Time   COLORURINE YELLOW 11/08/2022 1155   APPEARANCEUR CLEAR 11/08/2022 1155   LABSPEC 1.016 11/08/2022 1155   PHURINE 6.5 11/08/2022 1155   GLUCOSEU NEGATIVE 11/08/2022 1155   HGBUR NEGATIVE 11/08/2022 1155   BILIRUBINUR NEGATIVE 11/08/2022 1155   BILIRUBINUR neg 01/09/2014 1238   KETONESUR 40 (A) 11/08/2022 1155   PROTEINUR NEGATIVE 11/08/2022 1155   UROBILINOGEN negative 01/09/2014 1238   UROBILINOGEN 0.2 10/18/2007 0918   NITRITE NEGATIVE 11/08/2022 1155   LEUKOCYTESUR TRACE (A) 11/08/2022 1155   Sepsis Labs Recent Labs  Lab 11/08/22 1155  WBC 2.2*   Microbiology No results found for this or any previous visit (from the past 240 hour(s)).   Time coordinating discharge: Over 30 minutes  SIGNED:   Alvira Philips [place], DO  [facility] 11/10/2022, 9:08 AM

## 2022-11-18 DIAGNOSIS — E871 Hypo-osmolality and hyponatremia: Secondary | ICD-10-CM | POA: Diagnosis not present

## 2022-11-24 ENCOUNTER — Other Ambulatory Visit: Payer: Self-pay | Admitting: Obstetrics and Gynecology

## 2022-11-24 ENCOUNTER — Other Ambulatory Visit: Payer: Self-pay

## 2022-11-24 DIAGNOSIS — Z0000 Encounter for general adult medical examination without abnormal findings: Secondary | ICD-10-CM

## 2022-11-24 DIAGNOSIS — Z1231 Encounter for screening mammogram for malignant neoplasm of breast: Secondary | ICD-10-CM

## 2022-11-25 DIAGNOSIS — F411 Generalized anxiety disorder: Secondary | ICD-10-CM | POA: Diagnosis not present

## 2022-12-01 DIAGNOSIS — F411 Generalized anxiety disorder: Secondary | ICD-10-CM | POA: Diagnosis not present

## 2022-12-10 DIAGNOSIS — F411 Generalized anxiety disorder: Secondary | ICD-10-CM | POA: Diagnosis not present

## 2022-12-22 DIAGNOSIS — L814 Other melanin hyperpigmentation: Secondary | ICD-10-CM | POA: Diagnosis not present

## 2022-12-22 DIAGNOSIS — L821 Other seborrheic keratosis: Secondary | ICD-10-CM | POA: Diagnosis not present

## 2022-12-22 DIAGNOSIS — D225 Melanocytic nevi of trunk: Secondary | ICD-10-CM | POA: Diagnosis not present

## 2022-12-22 DIAGNOSIS — Z86018 Personal history of other benign neoplasm: Secondary | ICD-10-CM | POA: Diagnosis not present

## 2022-12-25 ENCOUNTER — Ambulatory Visit
Admission: RE | Admit: 2022-12-25 | Discharge: 2022-12-25 | Disposition: A | Discharge status: Home / Self Care | Payer: BC Managed Care – PPO | Source: Ambulatory Visit | Attending: Obstetrics and Gynecology | Admitting: Obstetrics and Gynecology

## 2022-12-25 DIAGNOSIS — Z1231 Encounter for screening mammogram for malignant neoplasm of breast: Secondary | ICD-10-CM | POA: Diagnosis not present

## 2023-01-15 DIAGNOSIS — F331 Major depressive disorder, recurrent, moderate: Secondary | ICD-10-CM | POA: Diagnosis not present

## 2023-01-15 DIAGNOSIS — F411 Generalized anxiety disorder: Secondary | ICD-10-CM | POA: Diagnosis not present

## 2023-02-17 DIAGNOSIS — F411 Generalized anxiety disorder: Secondary | ICD-10-CM | POA: Diagnosis not present

## 2023-02-26 ENCOUNTER — Encounter: Payer: Self-pay | Admitting: Obstetrics and Gynecology

## 2023-02-26 ENCOUNTER — Other Ambulatory Visit (HOSPITAL_COMMUNITY)
Admission: RE | Admit: 2023-02-26 | Discharge: 2023-02-26 | Disposition: A | Discharge status: Home / Self Care | Payer: BC Managed Care – PPO | Source: Ambulatory Visit | Attending: Obstetrics and Gynecology | Admitting: Obstetrics and Gynecology

## 2023-02-26 ENCOUNTER — Ambulatory Visit: Payer: BC Managed Care – PPO | Admitting: Obstetrics and Gynecology

## 2023-02-26 VITALS — BP 106/68 | HR 72 | Ht 64.75 in | Wt 149.0 lb

## 2023-02-26 DIAGNOSIS — Z1211 Encounter for screening for malignant neoplasm of colon: Secondary | ICD-10-CM

## 2023-02-26 DIAGNOSIS — Z01419 Encounter for gynecological examination (general) (routine) without abnormal findings: Secondary | ICD-10-CM | POA: Insufficient documentation

## 2023-02-26 DIAGNOSIS — R748 Abnormal levels of other serum enzymes: Secondary | ICD-10-CM | POA: Diagnosis not present

## 2023-02-26 DIAGNOSIS — D219 Benign neoplasm of connective and other soft tissue, unspecified: Secondary | ICD-10-CM | POA: Diagnosis not present

## 2023-02-26 DIAGNOSIS — Z79899 Other long term (current) drug therapy: Secondary | ICD-10-CM | POA: Diagnosis not present

## 2023-02-26 DIAGNOSIS — E785 Hyperlipidemia, unspecified: Secondary | ICD-10-CM | POA: Diagnosis not present

## 2023-02-26 DIAGNOSIS — R5382 Chronic fatigue, unspecified: Secondary | ICD-10-CM | POA: Diagnosis not present

## 2023-02-26 MED ORDER — INTRAROSA 6.5 MG VA INST: 1.0000 | VAGINAL_INSERT | Freq: Every day | VAGINAL | 12 refills | Status: AC

## 2023-02-26 MED ORDER — INTRAROSA 6.5 MG VA INST: 1.0000 | VAGINAL_INSERT | Freq: Every evening | VAGINAL | 6 refills | Status: AC | PRN

## 2023-02-26 MED ORDER — ESTRADIOL 0.1 MG/GM VA CREA: 1.0000 | TOPICAL_CREAM | Freq: Every day | VAGINAL | 12 refills | Status: AC

## 2023-02-26 NOTE — Progress Notes (Signed)
62 y.o. y.o. female here for annual exam. Patient's last menstrual period was 11/06/2011.    Patient's last menstrual period was 11/06/2011.          Sexually active: Yes.    The current method of family planning is post menopausal status.    Exercising: Yes.    Yoga Smoker:  no Reports fatigue, weight gain, no depression but no energy to do much Dyspareunia and decreased libido H/o of IVF and is concerned for endometrial cancer after prolonged treatments.  Health Maintenance: Pap:  06/16/2018 Normal HPV Neg, -20-17 ASCUS NEG HR HPV History of abnormal Pap:  yes +HPV, no h/o surgery on her cervix.  MMG:  2024 birads 1 neg BMD:   n/a- baseline order placed.  Mother with osteoporosis Colonoscopy: 05/05/13 Polyps, f/u 10 years  TDaP:  UPT with PCP  Gardasil: n/a Body mass index is 24.99 kg/m.     02/26/2023    3:02 PM  Depression screen PHQ 2/9  Decreased Interest 3  Down, Depressed, Hopeless 2  PHQ - 2 Score 5  Altered sleeping 1  Tired, decreased energy 3  Change in appetite 1  Feeling bad or failure about yourself  0  Trouble concentrating 1  Moving slowly or fidgety/restless 0  Suicidal thoughts 0  PHQ-9 Score 11  Difficult doing work/chores Not difficult at all    Height 5' 4.75" (1.645 m), weight 149 lb (67.6 kg), last menstrual period 11/06/2011.     Component Value Date/Time   DIAGPAP  06/16/2018 0000    NEGATIVE FOR INTRAEPITHELIAL LESIONS OR MALIGNANCY.   ADEQPAP  06/16/2018 0000    Satisfactory for evaluation  endocervical/transformation zone component PRESENT.    GYN HISTORY:    Component Value Date/Time   DIAGPAP  06/16/2018 0000    NEGATIVE FOR INTRAEPITHELIAL LESIONS OR MALIGNANCY.   ADEQPAP  06/16/2018 0000    Satisfactory for evaluation  endocervical/transformation zone component PRESENT.    OB History  Gravida Para Term Preterm AB Living  1 1 1     1   SAB IAB Ectopic Multiple Live Births          1    # Outcome Date GA Lbr Len/2nd  Weight Sex Type Anes PTL Lv  1 Term 11/1988 [redacted]w[redacted]d  6 lb (2.722 kg) F Vag-Spont   LIV    Past Medical History:  Diagnosis Date   Acid reflux    Anxiety    ASCUS with positive high risk HPV 2006   Depression    Fibromyalgia    Hypercholesteremia 1999   Hypertension 2005   Restless leg syndrome 2003   Urinary incontinence    tx'd with [facility]    Past Surgical History:  Procedure Laterality Date   BLADDER SUSPENSION  2010   BUNIONECTOMY Left 04/15/2016   DIAGNOSTIC LAPAROSCOPY  1994    Normal   TUBAL LIGATION  2004    Current Outpatient Medications on File Prior to Visit  Medication Sig Dispense Refill   B Complex Vitamins (B COMPLEX PO) Take 1 tablet by mouth daily.     buPROPion (WELLBUTRIN XL) 300 MG 24 hr tablet Take 300 mg by mouth daily.     carvedilol (COREG) 25 MG tablet Take 25 mg by mouth 2 (two) times daily with a meal.  2   escitalopram (LEXAPRO) 5 MG tablet Take 5 mg by mouth daily.     FOLIC ACID PO Take 1 capsule by mouth daily.  gabapentin (NEURONTIN) 400 MG capsule Take 400 mg by mouth at bedtime.  1   Omega-3 Fatty Acids (OMEGA 3 PO) Take 1 capsule by mouth daily.     simvastatin (ZOCOR) 10 MG tablet Take 10 mg by mouth daily.     terazosin (HYTRIN) 2 MG capsule Take 2 mg by mouth daily.     No current facility-administered medications on file prior to visit.    Social History   Socioeconomic History   Marital status: Married    Spouse name: Not on file   Number of children: Not on file   Years of education: Not on file   Highest education level: Not on file  Occupational History   Not on file  Tobacco Use   Smoking status: Never   Smokeless tobacco: Never  Vaping Use   Vaping status: Never Used  Substance and Sexual Activity   Alcohol use: Yes    Alcohol/week: 7.0 standard drinks of alcohol    Types: 7 Standard drinks or equivalent per week   Drug use: No   Sexual activity: Yes    Partners: Male    Birth control/protection:  Post-menopausal  Other Topics Concern   Not on file  Social History Narrative   Not on file   Social Determinants of Health   Financial Resource Strain: Not on file  Food Insecurity: No Food Insecurity (11/08/2022)   Hunger Vital Sign    Worried About Running Out of Food in the Last Year: Never true    Ran Out of Food in the Last Year: Never true  Transportation Needs: Patient Declined (11/08/2022)   PRAPARE - Administrator, Civil Service (Medical): Patient declined    Lack of Transportation (Non-Medical): Patient declined  Physical Activity: Not on file  Stress: Not on file  Social Connections: Not on file  Intimate Partner Violence: Patient Declined (11/08/2022)   Humiliation, Afraid, Rape, and Kick questionnaire    Fear of Current or Ex-Partner: Patient declined    Emotionally Abused: Patient declined    Physically Abused: Patient declined    Sexually Abused: Patient declined    Family History  Problem Relation Age of Onset   Hypertension Mother    Depression Mother    Osteoporosis Mother 45   Hypertension Father    Depression Father    Hypertension Maternal Grandmother    Hypertension Maternal Grandfather    Depression Paternal Grandmother    Breast cancer Maternal Aunt 48       died in her 15's     Allergies  Allergen Reactions   Penicillins Shortness Of Breath and Rash    Tightening of throat   Amlodipine    Erythromycin     GI Upset   Lisinopril Cough   Sulfa Antibiotics Rash      Patient's last menstrual period was Patient's last menstrual period was 11/06/2011.Marland Kitchen            Review of Systems Alls systems reviewed and are negative.     Physical Exam Constitutional:      Appearance: Normal appearance.  Genitourinary:     Vulva and urethral meatus normal.     No lesions in the vagina.     Right Labia: No rash, lesions or skin changes.    Left Labia: No lesions, skin changes or rash.    No vaginal discharge or tenderness.     No  vaginal prolapse present.    No vaginal atrophy present.  Right Adnexa: not tender, not palpable and no mass present.    Left Adnexa: not tender, not palpable and no mass present.    No cervical motion tenderness or discharge.     Uterus is not enlarged, tender or irregular.  Breasts:    Right: Normal.     Left: Normal.  HENT:     Head: Normocephalic.  Neck:     Thyroid: No thyroid mass, thyromegaly or thyroid tenderness.  Cardiovascular:     Rate and Rhythm: Normal rate and regular rhythm.     Heart sounds: Normal heart sounds, S1 normal and S2 normal.  Pulmonary:     Effort: Pulmonary effort is normal.     Breath sounds: Normal breath sounds and air entry.  Abdominal:     General: There is no distension.     Palpations: Abdomen is soft. There is no mass.     Tenderness: There is no abdominal tenderness. There is no guarding or rebound.  Musculoskeletal:        General: Normal range of motion.     Cervical back: Full passive range of motion without pain, normal range of motion and neck supple. No tenderness.     Right lower leg: No edema.     Left lower leg: No edema.  Neurological:     Mental Status: She is alert.  Skin:    General: Skin is warm.  Psychiatric:        Mood and Affect: Mood normal.        Behavior: Behavior normal.        Thought Content: Thought content normal.  Vitals and nursing note reviewed. Exam conducted with a chaperone present.       A:         Well Woman GYN exam                             P:        Pap smear collected today Encouraged annual mammogram screening Colon cancer screening referral placed today DXA ordered today Labs and immunizations ordered today Discussed breast self exams Encouraged healthy lifestyle practices Encouraged Vit D and Calcium  Vaginal estrogen for atrophic vaginitis Intrarosa sent for decreased libido  No follow-ups on file.  Earley Favor

## 2023-03-03 DIAGNOSIS — F411 Generalized anxiety disorder: Secondary | ICD-10-CM | POA: Diagnosis not present

## 2023-03-03 LAB — TESTOS,TOTAL,FREE AND SHBG (FEMALE)
Free Testosterone: 0.9 pg/mL (ref 0.1–6.4)
Sex Hormone Binding: 39.3 nmol/L (ref 14–73)
Testosterone, Total, LC-MS-MS: 8 ng/dL (ref 2–45)

## 2023-03-03 LAB — TSH: TSH: 1.29 m[IU]/L (ref 0.40–4.50)

## 2023-03-03 LAB — CBC
HCT: 40.5 % (ref 35.0–45.0)
Hemoglobin: 13.6 g/dL (ref 11.7–15.5)
MCH: 31.5 pg (ref 27.0–33.0)
MCHC: 33.6 g/dL (ref 32.0–36.0)
MCV: 93.8 fL (ref 80.0–100.0)
MPV: 9.4 fL (ref 7.5–12.5)
Platelets: 323 10*3/uL (ref 140–400)
RBC: 4.32 10*6/uL (ref 3.80–5.10)
RDW: 12.4 % (ref 11.0–15.0)
WBC: 6.2 10*3/uL (ref 3.8–10.8)

## 2023-03-03 LAB — VITAMIN D 25 HYDROXY (VIT D DEFICIENCY, FRACTURES): Vit D, 25-Hydroxy: 44 ng/mL (ref 30–100)

## 2023-03-03 LAB — ESTRADIOL: Estradiol: 19 pg/mL

## 2023-03-04 LAB — CYTOLOGY - PAP
Comment: NEGATIVE
Diagnosis: NEGATIVE
High risk HPV: NEGATIVE

## 2023-03-16 DIAGNOSIS — H01115 Allergic dermatitis of left lower eyelid: Secondary | ICD-10-CM | POA: Diagnosis not present

## 2023-03-16 DIAGNOSIS — J020 Streptococcal pharyngitis: Secondary | ICD-10-CM | POA: Diagnosis not present

## 2023-03-16 DIAGNOSIS — J029 Acute pharyngitis, unspecified: Secondary | ICD-10-CM | POA: Diagnosis not present

## 2023-04-14 DIAGNOSIS — F411 Generalized anxiety disorder: Secondary | ICD-10-CM | POA: Diagnosis not present

## 2023-04-17 DIAGNOSIS — H01115 Allergic dermatitis of left lower eyelid: Secondary | ICD-10-CM | POA: Diagnosis not present

## 2023-04-17 DIAGNOSIS — H01114 Allergic dermatitis of left upper eyelid: Secondary | ICD-10-CM | POA: Diagnosis not present

## 2023-04-17 DIAGNOSIS — H01111 Allergic dermatitis of right upper eyelid: Secondary | ICD-10-CM | POA: Diagnosis not present

## 2023-04-17 DIAGNOSIS — H5203 Hypermetropia, bilateral: Secondary | ICD-10-CM | POA: Diagnosis not present

## 2023-04-17 DIAGNOSIS — H01112 Allergic dermatitis of right lower eyelid: Secondary | ICD-10-CM | POA: Diagnosis not present

## 2023-04-27 ENCOUNTER — Encounter: Payer: Self-pay | Admitting: Internal Medicine

## 2023-05-05 DIAGNOSIS — F331 Major depressive disorder, recurrent, moderate: Secondary | ICD-10-CM | POA: Diagnosis not present

## 2023-05-05 DIAGNOSIS — F411 Generalized anxiety disorder: Secondary | ICD-10-CM | POA: Diagnosis not present

## 2023-05-11 DIAGNOSIS — L308 Other specified dermatitis: Secondary | ICD-10-CM | POA: Diagnosis not present

## 2023-05-12 DIAGNOSIS — F411 Generalized anxiety disorder: Secondary | ICD-10-CM | POA: Diagnosis not present

## 2023-05-26 DIAGNOSIS — Z1211 Encounter for screening for malignant neoplasm of colon: Secondary | ICD-10-CM | POA: Diagnosis not present

## 2023-06-02 DIAGNOSIS — F411 Generalized anxiety disorder: Secondary | ICD-10-CM | POA: Diagnosis not present

## 2023-07-07 DIAGNOSIS — F411 Generalized anxiety disorder: Secondary | ICD-10-CM | POA: Diagnosis not present

## 2023-07-21 DIAGNOSIS — F411 Generalized anxiety disorder: Secondary | ICD-10-CM | POA: Diagnosis not present

## 2023-07-23 DIAGNOSIS — F331 Major depressive disorder, recurrent, moderate: Secondary | ICD-10-CM | POA: Diagnosis not present

## 2023-07-23 DIAGNOSIS — F411 Generalized anxiety disorder: Secondary | ICD-10-CM | POA: Diagnosis not present

## 2023-08-04 DIAGNOSIS — F411 Generalized anxiety disorder: Secondary | ICD-10-CM | POA: Diagnosis not present

## 2023-08-18 DIAGNOSIS — F411 Generalized anxiety disorder: Secondary | ICD-10-CM | POA: Diagnosis not present

## 2023-09-08 DIAGNOSIS — F411 Generalized anxiety disorder: Secondary | ICD-10-CM | POA: Diagnosis not present

## 2023-09-21 ENCOUNTER — Telehealth: Payer: Self-pay | Admitting: Obstetrics and Gynecology

## 2023-09-21 DIAGNOSIS — E2839 Other primary ovarian failure: Secondary | ICD-10-CM

## 2023-09-21 NOTE — Telephone Encounter (Signed)
 Bone scan referral

## 2023-09-29 DIAGNOSIS — F411 Generalized anxiety disorder: Secondary | ICD-10-CM | POA: Diagnosis not present

## 2023-10-13 DIAGNOSIS — F411 Generalized anxiety disorder: Secondary | ICD-10-CM | POA: Diagnosis not present

## 2023-10-21 ENCOUNTER — Other Ambulatory Visit: Payer: Self-pay

## 2023-10-21 MED ORDER — VALACYCLOVIR HCL 1 G PO TABS: 1000.0000 mg | ORAL_TABLET | Freq: Two times a day (BID) | ORAL | 0 refills | Status: AC

## 2023-10-21 NOTE — Telephone Encounter (Signed)
 Medication refill request: valtrex  Last AEX:  02-26-23 Next AEX: no appt scheduled Last MMG (if hormonal medication request): n/a Refill authorized: Patient has not been given this in about 2 yrs. please send in rx if appropriate

## 2023-10-29 DIAGNOSIS — Z1211 Encounter for screening for malignant neoplasm of colon: Secondary | ICD-10-CM | POA: Diagnosis not present

## 2023-10-29 DIAGNOSIS — K648 Other hemorrhoids: Secondary | ICD-10-CM | POA: Diagnosis not present

## 2023-10-29 DIAGNOSIS — Z09 Encounter for follow-up examination after completed treatment for conditions other than malignant neoplasm: Secondary | ICD-10-CM | POA: Diagnosis not present

## 2023-10-29 DIAGNOSIS — Z860100 Personal history of colon polyps, unspecified: Secondary | ICD-10-CM | POA: Diagnosis not present

## 2023-10-29 DIAGNOSIS — K5730 Diverticulosis of large intestine without perforation or abscess without bleeding: Secondary | ICD-10-CM | POA: Diagnosis not present

## 2023-10-30 DIAGNOSIS — N390 Urinary tract infection, site not specified: Secondary | ICD-10-CM | POA: Diagnosis not present

## 2023-11-13 DIAGNOSIS — F339 Major depressive disorder, recurrent, unspecified: Secondary | ICD-10-CM | POA: Diagnosis not present

## 2023-11-13 DIAGNOSIS — I10 Essential (primary) hypertension: Secondary | ICD-10-CM | POA: Diagnosis not present

## 2023-11-24 DIAGNOSIS — F411 Generalized anxiety disorder: Secondary | ICD-10-CM | POA: Diagnosis not present

## 2023-12-08 DIAGNOSIS — F411 Generalized anxiety disorder: Secondary | ICD-10-CM | POA: Diagnosis not present

## 2023-12-10 DIAGNOSIS — F331 Major depressive disorder, recurrent, moderate: Secondary | ICD-10-CM | POA: Diagnosis not present

## 2023-12-10 DIAGNOSIS — F411 Generalized anxiety disorder: Secondary | ICD-10-CM | POA: Diagnosis not present

## 2023-12-10 DIAGNOSIS — G4700 Insomnia, unspecified: Secondary | ICD-10-CM | POA: Diagnosis not present

## 2023-12-15 ENCOUNTER — Other Ambulatory Visit: Payer: Self-pay | Admitting: Obstetrics and Gynecology

## 2023-12-15 DIAGNOSIS — Z1231 Encounter for screening mammogram for malignant neoplasm of breast: Secondary | ICD-10-CM

## 2023-12-29 ENCOUNTER — Ambulatory Visit
Admission: RE | Admit: 2023-12-29 | Discharge: 2023-12-29 | Disposition: A | Discharge status: Home / Self Care | Source: Ambulatory Visit | Attending: Obstetrics and Gynecology | Admitting: Obstetrics and Gynecology

## 2023-12-29 DIAGNOSIS — B349 Viral infection, unspecified: Secondary | ICD-10-CM | POA: Diagnosis not present

## 2023-12-29 DIAGNOSIS — Z1231 Encounter for screening mammogram for malignant neoplasm of breast: Secondary | ICD-10-CM

## 2023-12-30 DIAGNOSIS — D225 Melanocytic nevi of trunk: Secondary | ICD-10-CM | POA: Diagnosis not present

## 2023-12-30 DIAGNOSIS — Z86018 Personal history of other benign neoplasm: Secondary | ICD-10-CM | POA: Diagnosis not present

## 2023-12-30 DIAGNOSIS — L821 Other seborrheic keratosis: Secondary | ICD-10-CM | POA: Diagnosis not present

## 2023-12-30 DIAGNOSIS — L814 Other melanin hyperpigmentation: Secondary | ICD-10-CM | POA: Diagnosis not present

## 2023-12-31 ENCOUNTER — Ambulatory Visit: Payer: Self-pay | Admitting: Obstetrics and Gynecology

## 2024-02-10 DIAGNOSIS — I10 Essential (primary) hypertension: Secondary | ICD-10-CM | POA: Diagnosis not present

## 2024-02-10 DIAGNOSIS — Z114 Encounter for screening for human immunodeficiency virus [HIV]: Secondary | ICD-10-CM | POA: Diagnosis not present

## 2024-02-10 DIAGNOSIS — Z1159 Encounter for screening for other viral diseases: Secondary | ICD-10-CM | POA: Diagnosis not present

## 2024-02-10 DIAGNOSIS — Z13220 Encounter for screening for lipoid disorders: Secondary | ICD-10-CM | POA: Diagnosis not present

## 2024-02-10 DIAGNOSIS — Z0000 Encounter for general adult medical examination without abnormal findings: Secondary | ICD-10-CM | POA: Diagnosis not present

## 2024-02-10 DIAGNOSIS — F339 Major depressive disorder, recurrent, unspecified: Secondary | ICD-10-CM | POA: Diagnosis not present

## 2024-02-10 DIAGNOSIS — Z23 Encounter for immunization: Secondary | ICD-10-CM | POA: Diagnosis not present

## 2024-02-16 DIAGNOSIS — F411 Generalized anxiety disorder: Secondary | ICD-10-CM | POA: Diagnosis not present

## 2024-03-01 DIAGNOSIS — F411 Generalized anxiety disorder: Secondary | ICD-10-CM | POA: Diagnosis not present

## 2024-03-07 DIAGNOSIS — J019 Acute sinusitis, unspecified: Secondary | ICD-10-CM | POA: Diagnosis not present

## 2024-03-07 DIAGNOSIS — B9689 Other specified bacterial agents as the cause of diseases classified elsewhere: Secondary | ICD-10-CM | POA: Diagnosis not present

## 2024-03-10 DIAGNOSIS — F411 Generalized anxiety disorder: Secondary | ICD-10-CM | POA: Diagnosis not present

## 2024-03-10 DIAGNOSIS — F331 Major depressive disorder, recurrent, moderate: Secondary | ICD-10-CM | POA: Diagnosis not present

## 2024-03-10 DIAGNOSIS — G4700 Insomnia, unspecified: Secondary | ICD-10-CM | POA: Diagnosis not present

## 2024-03-14 DIAGNOSIS — R062 Wheezing: Secondary | ICD-10-CM | POA: Diagnosis not present

## 2024-03-14 DIAGNOSIS — R052 Subacute cough: Secondary | ICD-10-CM | POA: Diagnosis not present

## 2024-03-14 DIAGNOSIS — R059 Cough, unspecified: Secondary | ICD-10-CM | POA: Diagnosis not present

## 2024-03-15 DIAGNOSIS — F411 Generalized anxiety disorder: Secondary | ICD-10-CM | POA: Diagnosis not present
